# Patient Record
Sex: Female | Born: 1970 | Race: Black or African American | Hispanic: No | Marital: Married | Smoking: Never smoker
Health system: Southern US, Community
[De-identification: ages and names within clinical notes are randomized; demographics above are authoritative.]

## PROBLEM LIST (undated history)

## (undated) DIAGNOSIS — I10 Essential (primary) hypertension: Secondary | ICD-10-CM

## (undated) HISTORY — DX: Essential (primary) hypertension: I10

## (undated) HISTORY — PX: APPENDECTOMY: SHX54

---

## 2009-12-27 ENCOUNTER — Emergency Department (HOSPITAL_COMMUNITY): Admission: EM | Admit: 2009-12-27 | Discharge: 2009-12-27 | Payer: Self-pay | Admitting: Family Medicine

## 2009-12-27 ENCOUNTER — Inpatient Hospital Stay (HOSPITAL_COMMUNITY): Admission: EM | Admit: 2009-12-27 | Discharge: 2009-12-30 | Payer: Self-pay | Admitting: Emergency Medicine

## 2010-01-05 ENCOUNTER — Encounter: Admission: RE | Admit: 2010-01-05 | Discharge: 2010-01-05 | Payer: Self-pay | Admitting: General Surgery

## 2010-01-09 ENCOUNTER — Ambulatory Visit (HOSPITAL_COMMUNITY): Admission: RE | Admit: 2010-01-09 | Discharge: 2010-01-09 | Payer: Self-pay | Admitting: General Surgery

## 2010-12-01 LAB — CBC
HCT: 28.6 % — ABNORMAL LOW (ref 36.0–46.0)
Hemoglobin: 13.3 g/dL (ref 12.0–15.0)
Hemoglobin: 9.7 g/dL — ABNORMAL LOW (ref 12.0–15.0)
Hemoglobin: 9.9 g/dL — ABNORMAL LOW (ref 12.0–15.0)
MCHC: 34.5 g/dL (ref 30.0–36.0)
MCV: 88.8 fL (ref 78.0–100.0)
Platelets: 247 10*3/uL (ref 150–400)
Platelets: 256 10*3/uL (ref 150–400)
Platelets: 309 10*3/uL (ref 150–400)
RBC: 3.22 MIL/uL — ABNORMAL LOW (ref 3.87–5.11)
RBC: 3.35 MIL/uL — ABNORMAL LOW (ref 3.87–5.11)
RBC: 4.37 MIL/uL (ref 3.87–5.11)
RDW: 12.3 % (ref 11.5–15.5)
RDW: 12.3 % (ref 11.5–15.5)
WBC: 15.9 10*3/uL — ABNORMAL HIGH (ref 4.0–10.5)
WBC: 20.4 10*3/uL — ABNORMAL HIGH (ref 4.0–10.5)
WBC: 5.9 10*3/uL (ref 4.0–10.5)
WBC: 9.8 10*3/uL (ref 4.0–10.5)

## 2010-12-01 LAB — DIFFERENTIAL
Basophils Absolute: 0 10*3/uL (ref 0.0–0.1)
Basophils Relative: 0 % (ref 0–1)
Eosinophils Absolute: 0 10*3/uL (ref 0.0–0.7)
Eosinophils Relative: 0 % (ref 0–5)
Lymphocytes Relative: 9 % — ABNORMAL LOW (ref 12–46)
Monocytes Absolute: 0.9 10*3/uL (ref 0.1–1.0)
Monocytes Relative: 4 % (ref 3–12)
Neutrophils Relative %: 87 % — ABNORMAL HIGH (ref 43–77)

## 2010-12-01 LAB — URINALYSIS, ROUTINE W REFLEX MICROSCOPIC
Hgb urine dipstick: NEGATIVE
Ketones, ur: >80 mg/dL — AB
Nitrite: NEGATIVE
Specific Gravity, Urine: 1.015 (ref 1.005–1.030)
Urobilinogen, UA: 1 mg/dL (ref 0.0–1.0)

## 2010-12-01 LAB — POCT URINALYSIS DIP (DEVICE)
Glucose, UA: 100 mg/dL — AB
Protein, ur: 100 mg/dL — AB
Urobilinogen, UA: 2 mg/dL — ABNORMAL HIGH (ref 0.0–1.0)

## 2010-12-01 LAB — POCT I-STAT, CHEM 8
BUN: 5 mg/dL — ABNORMAL LOW (ref 6–23)
Chloride: 102 mEq/L (ref 96–112)
HCT: 40 % (ref 36.0–46.0)
Sodium: 135 mEq/L (ref 135–145)

## 2010-12-01 LAB — CULTURE, ROUTINE-ABSCESS

## 2010-12-01 LAB — PROTIME-INR: Prothrombin Time: 14 seconds (ref 11.6–15.2)

## 2010-12-01 LAB — POCT PREGNANCY, URINE: Preg Test, Ur: NEGATIVE

## 2011-01-15 IMAGING — CT CT ABD-PELV W/ CM
2 of 4 series · 10 of 36 positions shown, 17 images · IV contrast (READICAT/WATER & [ID] OMNI [id number])
Comparison: CT abdomen pelvis of 12/27/2009

CLINICAL DATA: Follow up of pelvic abscess after ruptured appendix,
drainage catheter in place

CT ABDOMEN AND PELVIS WITH CONTRAST
TECHNIQUE: Multidetector CT imaging of the abdomen and pelvis was
performed following the standard protocol during bolus
administration of intravenous contrast.
Contrast: 100 ml Zmnipaque-D00

[Series 3: routine abdomen · axial · 0.62mm/px · z∈[-396,-116]mm · 9 of 70 slices shown, 15 images]
[im 7/70  soft-tissue]
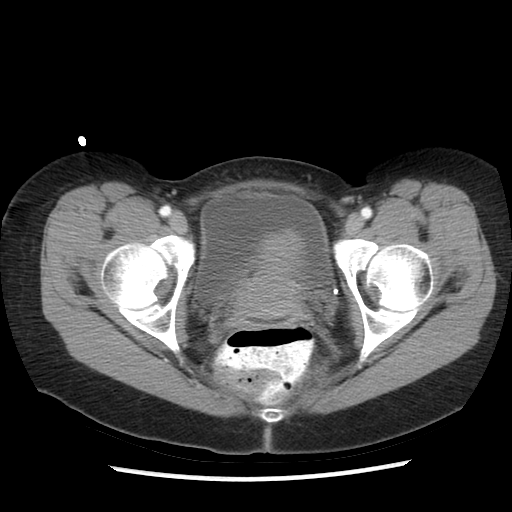
[im 7/70  bone]
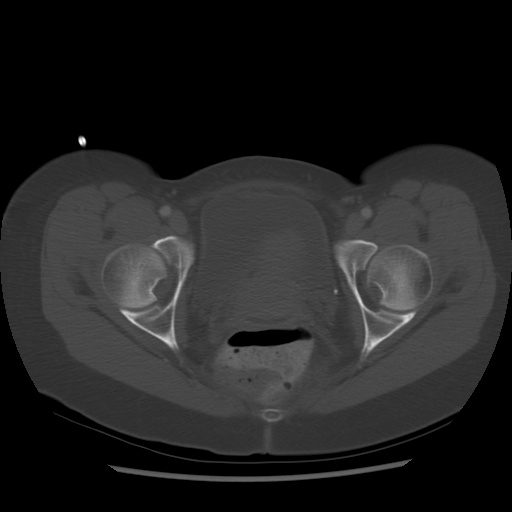
[im 14/70  soft-tissue]
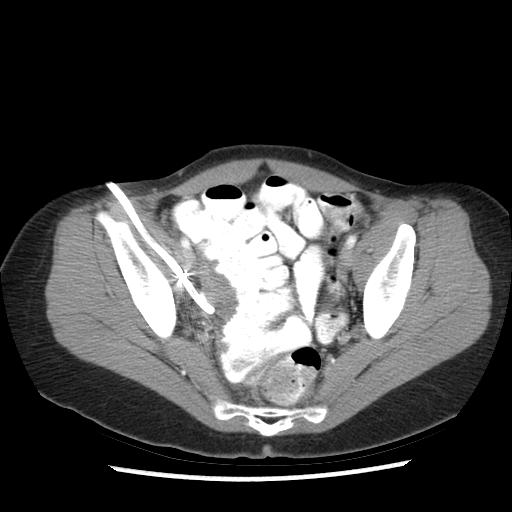
[im 21/70  soft-tissue]
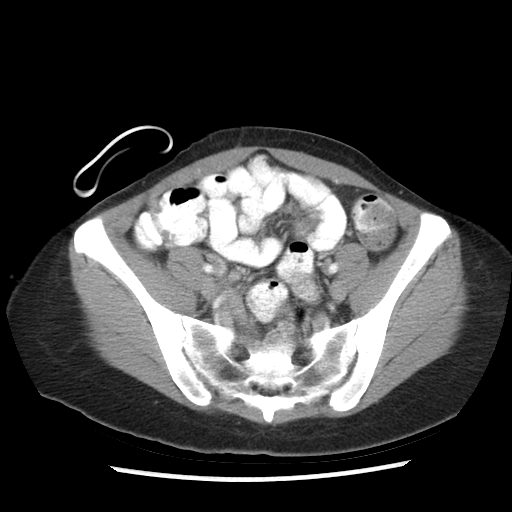
[im 28/70  soft-tissue]
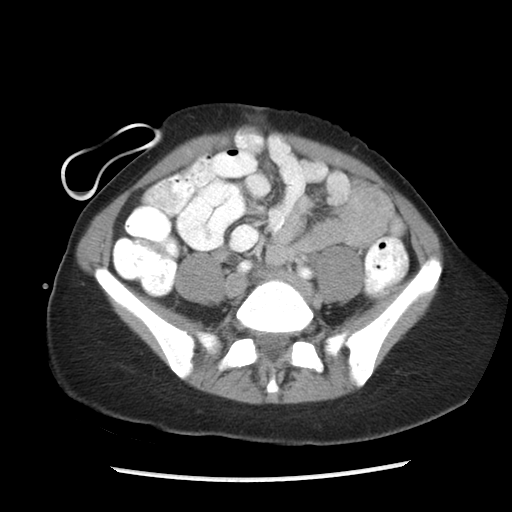
[im 35/70  soft-tissue]
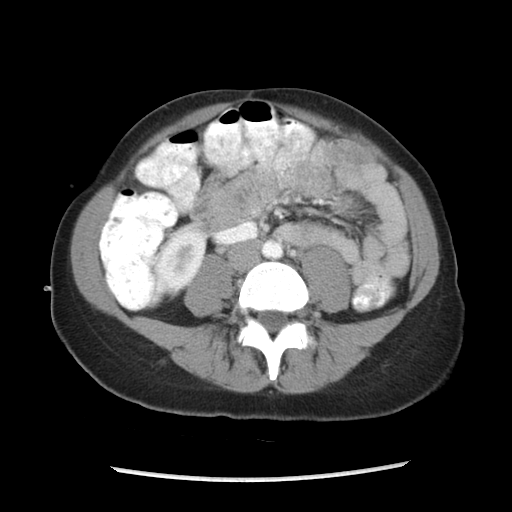
[im 42/70  soft-tissue]
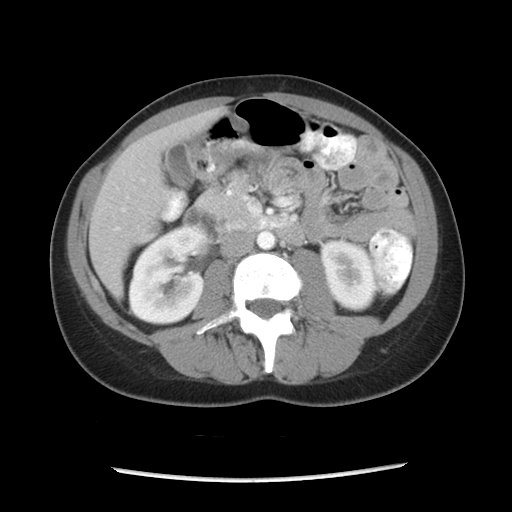
[im 42/70  lung]
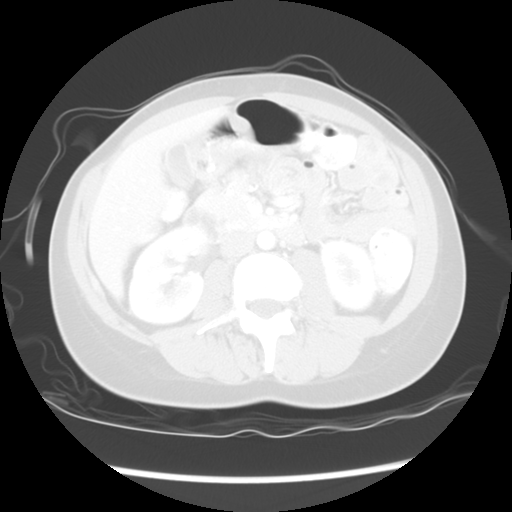
[im 49/70  soft-tissue]
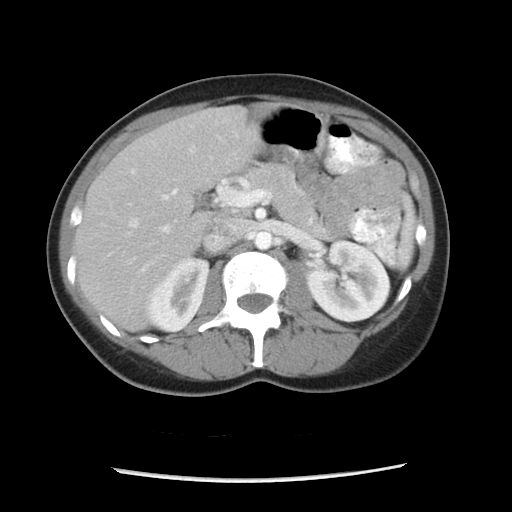
[im 49/70  lung]
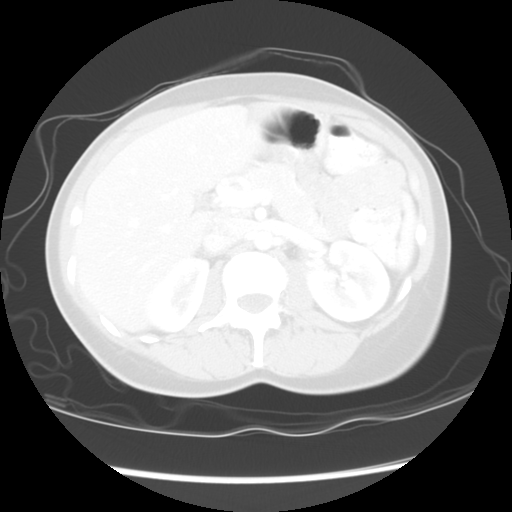
[im 56/70  soft-tissue]
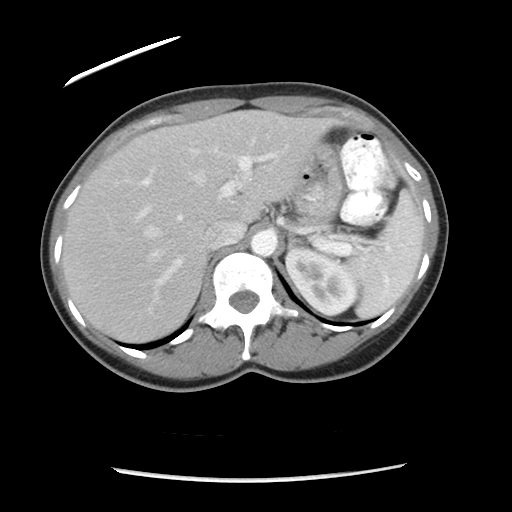
[im 56/70  lung]
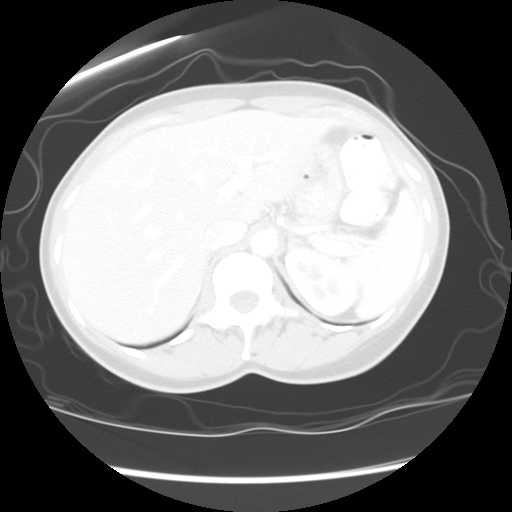
[im 63/70  soft-tissue]
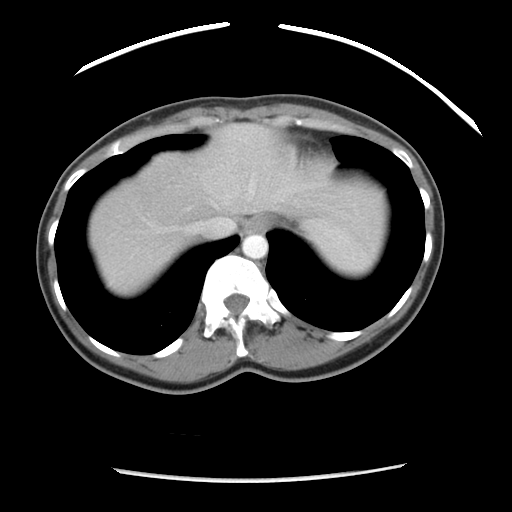
[im 63/70  lung]
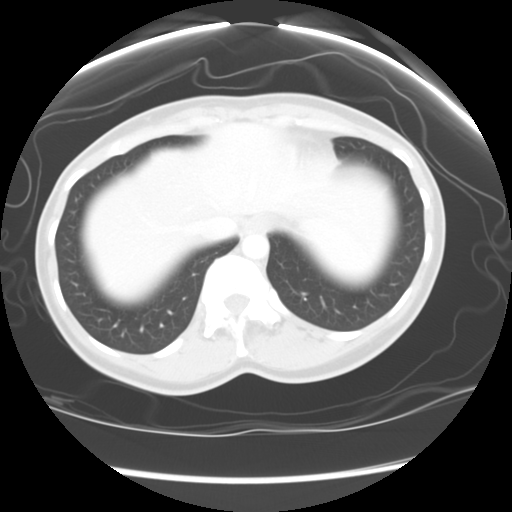
[im 63/70  bone]
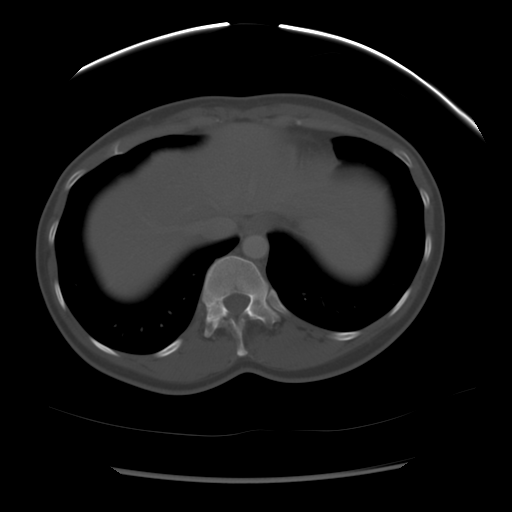

[Series 601: coronal body · coronal · 0.77mm/px · 1 of 98 slices shown, 2 images]
[im 33/98  soft-tissue]
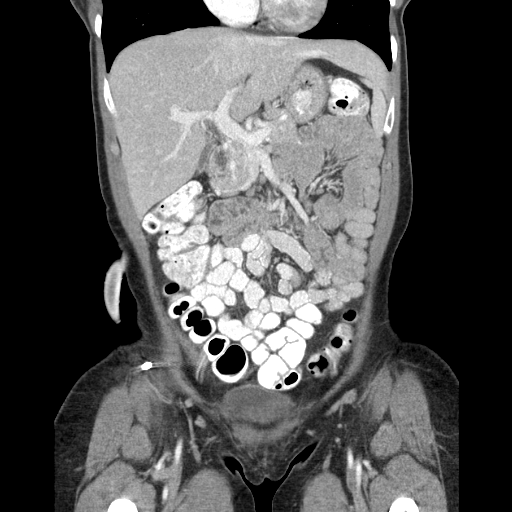
[im 33/98  bone]
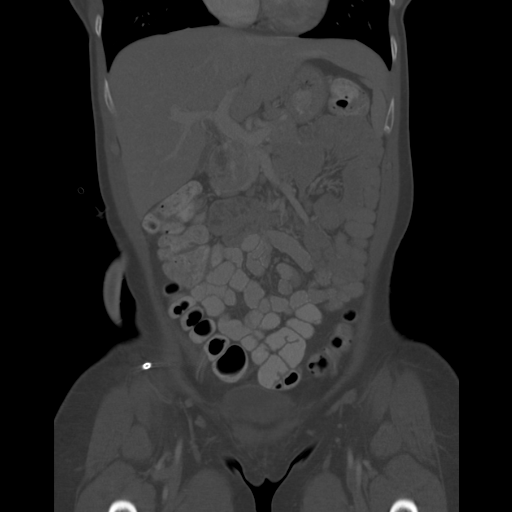

[10 of 36 positions shown; findings below may reference images not displayed]

FINDINGS: The lung bases are clear.  The liver enhances with no
focal abnormality and no ductal dilatation is seen.  Some fatty
infiltration near the false formal ligament is stable.  No
calcified gallstones are seen.  The pancreas is normal in size and
the pancreatic duct is not dilated.  The adrenal glands and spleen
are unremarkable.  The kidneys enhance with no calculus or mass and
no hydronephrosis is seen.  The abdominal aorta is normal in
caliber.

The percutaneous abscess drainage catheter remains in the right
pelvic sidewall and the abscess cavity is decompressed.  No
residual fluid collection is seen.  The uterus is normal in size.
The urinary bladder is unremarkable.  The cecum is very low lying
within the posterior pelvis.  The terminal ileum is unremarkable.
IMPRESSION: Percutaneous abscess drainage catheter remains in place in the
right pelvic sidewall with decompression of the pelvic abscess.  No
residual abscess cavity is seen.  No free fluid is noted.

## 2012-01-17 ENCOUNTER — Telehealth: Payer: Self-pay

## 2012-01-17 NOTE — Telephone Encounter (Signed)
Rf's called for Nuvaring. 1 PV q 3 weeks w/ RF's to get her through til AEX 04/14/2012. Melody Comas A

## 2012-02-10 ENCOUNTER — Telehealth: Payer: Self-pay

## 2012-02-10 NOTE — Telephone Encounter (Signed)
LM for pt to cb and r/s AEX booked for 04/14/2012. Dr. Su Hilt is going to be on call that day. Melody Comas A

## 2012-02-16 NOTE — Telephone Encounter (Signed)
Chart note for encounter closure only. JO CMA

## 2012-02-25 ENCOUNTER — Other Ambulatory Visit: Payer: Self-pay | Admitting: Obstetrics and Gynecology

## 2012-02-25 ENCOUNTER — Ambulatory Visit (HOSPITAL_BASED_OUTPATIENT_CLINIC_OR_DEPARTMENT_OTHER)
Admission: RE | Admit: 2012-02-25 | Discharge: 2012-02-25 | Disposition: A | Discharge status: Home / Self Care | Payer: BC Managed Care – PPO | Source: Ambulatory Visit | Attending: Obstetrics and Gynecology | Admitting: Obstetrics and Gynecology

## 2012-02-25 ENCOUNTER — Other Ambulatory Visit (HOSPITAL_COMMUNITY)
Admission: RE | Admit: 2012-02-25 | Discharge: 2012-02-25 | Disposition: A | Discharge status: Home / Self Care | Payer: BC Managed Care – PPO | Source: Ambulatory Visit | Attending: Obstetrics and Gynecology | Admitting: Obstetrics and Gynecology

## 2012-02-25 DIAGNOSIS — Z1231 Encounter for screening mammogram for malignant neoplasm of breast: Secondary | ICD-10-CM

## 2012-02-25 DIAGNOSIS — Z124 Encounter for screening for malignant neoplasm of cervix: Secondary | ICD-10-CM | POA: Insufficient documentation

## 2012-03-31 ENCOUNTER — Ambulatory Visit: Payer: Self-pay | Admitting: Obstetrics and Gynecology

## 2012-04-14 ENCOUNTER — Ambulatory Visit: Payer: Self-pay | Admitting: Obstetrics and Gynecology

## 2012-05-03 ENCOUNTER — Telehealth: Payer: Self-pay

## 2012-05-03 NOTE — Telephone Encounter (Signed)
Denied RF for Nuvaring. Pt is well overdue for AEX and has cancelled her last appts x 2. NO RF until pt makes and keeps appt. Sophia Mason A

## 2014-10-10 ENCOUNTER — Other Ambulatory Visit (HOSPITAL_BASED_OUTPATIENT_CLINIC_OR_DEPARTMENT_OTHER): Payer: Self-pay | Admitting: Obstetrics and Gynecology

## 2014-10-10 DIAGNOSIS — Z1231 Encounter for screening mammogram for malignant neoplasm of breast: Secondary | ICD-10-CM

## 2014-10-14 ENCOUNTER — Ambulatory Visit (HOSPITAL_BASED_OUTPATIENT_CLINIC_OR_DEPARTMENT_OTHER)
Admission: RE | Admit: 2014-10-14 | Discharge: 2014-10-14 | Disposition: A | Discharge status: Home / Self Care | Payer: BC Managed Care – PPO | Source: Ambulatory Visit | Attending: Obstetrics and Gynecology | Admitting: Obstetrics and Gynecology

## 2014-10-14 DIAGNOSIS — Z1231 Encounter for screening mammogram for malignant neoplasm of breast: Secondary | ICD-10-CM | POA: Insufficient documentation

## 2015-07-21 ENCOUNTER — Other Ambulatory Visit (HOSPITAL_BASED_OUTPATIENT_CLINIC_OR_DEPARTMENT_OTHER): Payer: Self-pay | Admitting: Obstetrics and Gynecology

## 2015-07-21 DIAGNOSIS — Z1231 Encounter for screening mammogram for malignant neoplasm of breast: Secondary | ICD-10-CM

## 2015-10-20 ENCOUNTER — Ambulatory Visit (HOSPITAL_BASED_OUTPATIENT_CLINIC_OR_DEPARTMENT_OTHER)
Admission: RE | Admit: 2015-10-20 | Discharge: 2015-10-20 | Disposition: A | Discharge status: Home / Self Care | Payer: BC Managed Care – PPO | Source: Ambulatory Visit | Attending: Obstetrics and Gynecology | Admitting: Obstetrics and Gynecology

## 2015-10-20 ENCOUNTER — Ambulatory Visit (HOSPITAL_BASED_OUTPATIENT_CLINIC_OR_DEPARTMENT_OTHER): Payer: BC Managed Care – PPO

## 2015-10-20 DIAGNOSIS — Z1231 Encounter for screening mammogram for malignant neoplasm of breast: Secondary | ICD-10-CM | POA: Diagnosis not present

## 2017-02-22 ENCOUNTER — Other Ambulatory Visit (HOSPITAL_BASED_OUTPATIENT_CLINIC_OR_DEPARTMENT_OTHER): Payer: Self-pay | Admitting: Obstetrics and Gynecology

## 2017-02-22 DIAGNOSIS — Z1231 Encounter for screening mammogram for malignant neoplasm of breast: Secondary | ICD-10-CM

## 2017-03-04 ENCOUNTER — Ambulatory Visit (HOSPITAL_BASED_OUTPATIENT_CLINIC_OR_DEPARTMENT_OTHER)
Admission: RE | Admit: 2017-03-04 | Discharge: 2017-03-04 | Disposition: A | Discharge status: Home / Self Care | Payer: BC Managed Care – PPO | Source: Ambulatory Visit | Attending: Obstetrics and Gynecology | Admitting: Obstetrics and Gynecology

## 2017-03-04 DIAGNOSIS — Z1231 Encounter for screening mammogram for malignant neoplasm of breast: Secondary | ICD-10-CM | POA: Diagnosis not present

## 2018-05-16 ENCOUNTER — Encounter: Payer: Self-pay | Admitting: Cardiovascular Disease

## 2018-05-16 ENCOUNTER — Ambulatory Visit: Payer: BC Managed Care – PPO | Admitting: Cardiovascular Disease

## 2018-05-16 VITALS — BP 120/83 | HR 83 | Ht 62.0 in | Wt 120.4 lb

## 2018-05-16 DIAGNOSIS — Z5181 Encounter for therapeutic drug level monitoring: Secondary | ICD-10-CM | POA: Diagnosis not present

## 2018-05-16 DIAGNOSIS — I10 Essential (primary) hypertension: Secondary | ICD-10-CM | POA: Diagnosis not present

## 2018-05-16 HISTORY — DX: Essential (primary) hypertension: I10

## 2018-05-16 MED ORDER — HYDROCHLOROTHIAZIDE 12.5 MG PO TABS: 12.5000 mg | ORAL_TABLET | Freq: Every day | ORAL | 1 refills | Status: DC

## 2018-05-16 MED ORDER — AMLODIPINE BESYLATE 5 MG PO TABS: 5.0000 mg | ORAL_TABLET | Freq: Every day | ORAL | 1 refills | Status: DC

## 2018-05-16 NOTE — Progress Notes (Signed)
Cardiology Office Note   Date:  05/16/2018   ID:  Sophia Mason, DOB 1971/09/03, MRN [id number]  PCP:  Sophia Better, MD  Cardiologist:   Sophia Si, MD   No chief complaint on file.     History of Present Illness: Sophia Mason is a 47 y.o. female who is being seen today for the evaluation of hypertension at the request of Sophia Lindau, NP.  Sophia Mason was first diagnosed with hypertension in approximately 2016.  She saw her OB/GYN and her blood pressure was noted to be elevated.  She changed her birth control method and started on the DASH diet with some improvement in her blood pressure.  However when she followed up 10/2017 her blood pressure remained elevated.  She was referred to Dr. Knox Mason 11/2017 and started on amlodipine 5 mg daily.  The dose was increased to 10 mg and her blood pressure has been mostly in the 120s over 80s since that time.  Her only complaint is that she has some ankle swelling with this medication.  She denies orthopnea or PND.  In general she is very active she tries to exercise at least 2 days/week.  She has no chest pain or shortness of breath with this activity.  She rarely has palpitations.  She has no lightheadedness or dizziness.  She continues to follow a low-sodium diet.  She is unsure why she has hypertension, is no one else in her family developed hypertension, especially at such young age.  She does report a history of preeclampsia.   Past Medical History:  Diagnosis Date  . Essential hypertension 05/16/2018  . Hypertension     History reviewed. No pertinent surgical history.   Current Outpatient Medications  Medication Sig Dispense Refill  . amLODipine (NORVASC) 5 MG tablet Take 1 tablet (5 mg total) by mouth daily. 90 tablet 1  . PARAGARD INTRAUTERINE COPPER IU by Intrauterine route.    . hydrochlorothiazide (HYDRODIURIL) 12.5 MG tablet Take 1 tablet (12.5 mg total) by mouth daily. 90 tablet 1   No current facility-administered  medications for this visit.     Allergies:   Patient has no known allergies.    Social History:  The patient  reports that she has never smoked. She has never used smokeless tobacco. She reports that she drinks alcohol.   Family History:  The patient's family history includes Breast cancer in her maternal aunt; Diabetes in her maternal aunt; Glaucoma in her maternal grandfather and mother; Hyperlipidemia in her father; Hypertension in her maternal grandfather.    ROS:  Please see the history of present illness.   Otherwise, review of systems are positive for none.   All other systems are reviewed and negative.    PHYSICAL EXAM: VS:  BP 120/83 (BP Location: Left Arm)   Pulse 83   Ht 5\' 2"  (1.575 m)   Wt 120 lb 6.4 oz (54.6 kg)   BMI 22.02 kg/m  , BMI Body mass index is 22.02 kg/m. GENERAL:  Well appearing HEENT:  Pupils equal round and reactive, fundi not visualized, oral mucosa unremarkable NECK:  No jugular venous distention, waveform within normal limits, carotid upstroke brisk and symmetric, no bruits LUNGS:  Clear to auscultation bilaterally HEART:  RRR.  PMI not displaced or sustained,S1 and S2 within normal limits, no S3, no S4, no clicks, no rubs, no murmurs ABD:  Flat, positive bowel sounds normal in frequency in pitch, no bruits, no rebound, no guarding, no midline pulsatile mass,  no hepatomegaly, no splenomegaly EXT:  2 plus pulses throughout, no edema, no cyanosis no clubbing SKIN:  No rashes no nodules NEURO:  Cranial nerves II through XII grossly intact, motor grossly intact throughout PSYCH:  Cognitively intact, oriented to person place and time   EKG:  EKG is ordered today. The ekg ordered today demonstrates sinus rhythm rate 83 bpm.   Recent Labs: No results found for requested labs within last 8760 hours.    Lipid Panel No results found for: CHOL, TRIG, HDL, CHOLHDL, VLDL, LDLCALC, LDLDIRECT    Wt Readings from Last 3 Encounters:  05/16/18 120 lb 6.4 oz  (54.6 kg)      ASSESSMENT AND PLAN:  # Essential hypertension:  BP is controlled but she has LE edema on amlodipine.  We will reduce the dose to 5 mg and add HCTZ 12.5 mg.  She will have a basic metabolic panel checked in 1 week.  Given that her blood pressure is so easily controlled with one agent it is unlikely that she has renal artery stenosis.  We will get a copy of her lipids from her PCP.   Current medicines are reviewed at length with the patient today.  The patient does not have concerns regarding medicines.  The following changes have been made:  Reduce amlodipine.  Start HCTZ.  Labs/ tests ordered today include:   Orders Placed This Encounter  Procedures  . Basic metabolic panel     Disposition:   FU with Sophia Sun C. Duke Salvia, MD, [facility] in 2 months     Signed, Sophia Aker C. Duke Salvia, MD, [facility]  05/16/2018 1:11 PM     [facility]

## 2018-05-16 NOTE — Patient Instructions (Addendum)
Medication Instructions:  DECREASE YOUR AMLODIPINE TO 5 MG DAILY  START HYDROCHLOROTHIAZIDE 12.5 MG DAILY   Labwork: BMET IN 1 WEEK   Testing/Procedures: NONE  Follow-Up: Your physician recommends that you schedule a follow-up appointment in: 2 MONTHS   Any Other Special Instructions Will Be Listed Below (If Applicable).  DR Dorothyann Peng [address] [phone]  If you need a refill on your cardiac medications before your next appointment, please call your pharmacy.

## 2018-05-26 LAB — BASIC METABOLIC PANEL
BUN / CREAT RATIO: 22 (ref 9–23)
BUN: 15 mg/dL (ref 6–24)
CO2: 25 mmol/L (ref 20–29)
Calcium: 10 mg/dL (ref 8.7–10.2)
Chloride: 96 mmol/L (ref 96–106)
Creatinine, Ser: 0.67 mg/dL (ref 0.57–1.00)
GFR calc non Af Amer: 106 mL/min/{1.73_m2} (ref >59)
GFR, EST AFRICAN AMERICAN: 122 mL/min/{1.73_m2} (ref >59)
Glucose: 91 mg/dL (ref 65–99)
POTASSIUM: 3.7 mmol/L (ref 3.5–5.2)
Sodium: 139 mmol/L (ref 134–144)

## 2018-07-06 ENCOUNTER — Other Ambulatory Visit (HOSPITAL_BASED_OUTPATIENT_CLINIC_OR_DEPARTMENT_OTHER): Payer: Self-pay | Admitting: Obstetrics and Gynecology

## 2018-07-06 DIAGNOSIS — Z1231 Encounter for screening mammogram for malignant neoplasm of breast: Secondary | ICD-10-CM

## 2018-07-10 ENCOUNTER — Ambulatory Visit (HOSPITAL_BASED_OUTPATIENT_CLINIC_OR_DEPARTMENT_OTHER)
Admission: RE | Admit: 2018-07-10 | Discharge: 2018-07-10 | Disposition: A | Discharge status: Home / Self Care | Payer: BC Managed Care – PPO | Source: Ambulatory Visit | Attending: Obstetrics and Gynecology | Admitting: Obstetrics and Gynecology

## 2018-07-10 DIAGNOSIS — Z1231 Encounter for screening mammogram for malignant neoplasm of breast: Secondary | ICD-10-CM | POA: Diagnosis not present

## 2018-07-17 ENCOUNTER — Encounter: Payer: Self-pay | Admitting: Cardiovascular Disease

## 2018-07-17 ENCOUNTER — Ambulatory Visit: Payer: BC Managed Care – PPO | Admitting: Cardiovascular Disease

## 2018-07-17 VITALS — BP 112/66 | HR 94 | Ht 62.0 in | Wt 121.6 lb

## 2018-07-17 DIAGNOSIS — I10 Essential (primary) hypertension: Secondary | ICD-10-CM

## 2018-07-17 MED ORDER — AMLODIPINE BESYLATE 5 MG PO TABS: 5.0000 mg | ORAL_TABLET | Freq: Every day | ORAL | 3 refills | Status: DC

## 2018-07-17 MED ORDER — HYDROCHLOROTHIAZIDE 12.5 MG PO TABS: 12.5000 mg | ORAL_TABLET | Freq: Every day | ORAL | 3 refills | Status: DC

## 2018-07-17 NOTE — Patient Instructions (Signed)
Medication Instructions:  ?Your physician recommends that you continue on your current medications as directed. Please refer to the Current Medication list given to you today.  ? ?Labwork: ?NONE ? ?Testing/Procedures: ?NONE ? ?Follow-Up: ?AS NEEDED  ? ?  ?

## 2018-07-17 NOTE — Progress Notes (Signed)
Cardiology Office Note   Date:  07/17/2018   ID:  Sophia Mason, DOB 1971/05/26, MRN [id number]  PCP:  Maxie Better, MD  Cardiologist:   Chilton Si, MD   No chief complaint on file.     History of Present Illness: Sophia Mason is a 47 y.o. female here for follow up.  Sophia Mason was initially seen 05/2018 for hypertension management.  Sophia Mason was first diagnosed with hypertension in approximately 2016.  Sophia Mason saw her OB/GYN and her blood pressure was noted to be elevated.  Sophia Mason changed her birth control method and started on Sophia DASH diet with some improvement in her blood pressure.  However when Sophia Mason followed up 10/2017 her blood pressure remained elevated.  Sophia Mason was referred to Dr. Knox Royalty 11/2017 and started on amlodipine 5 mg daily.  Sophia dose was increased to 10 mg and her blood pressure is well have been controlled but Sophia Mason developed lower extremity edema.  At her last appointment distance Sophia Mason was started on HCTZ.  Since then her blood pressure has been very well-controlled.  Sophia Mason works out regularly and notices that for Sophia first month Sophia Mason had some mild lightheadedness with positional changes during exercise.  This is subsequently subsided.  Sophia Mason has not experienced any lower extremity edema, orthopnea, PND, chest pain, or shortness of breath.  Overall Sophia Mason has been feeling well and has no complaints.    Past Medical History:  Diagnosis Date  . Essential hypertension 05/16/2018  . Hypertension     History reviewed. No pertinent surgical history.   Current Outpatient Medications  Medication Sig Dispense Refill  . amLODipine (NORVASC) 5 MG tablet Take 1 tablet (5 mg total) by mouth daily. 90 tablet 3  . hydrochlorothiazide (HYDRODIURIL) 12.5 MG tablet Take 1 tablet (12.5 mg total) by mouth daily. 90 tablet 3  . PARAGARD INTRAUTERINE COPPER IU by Intrauterine route.     No current facility-administered medications for this visit.     Allergies:   Sophia Mason has no known allergies.     Social History:  Sophia Mason  reports that Sophia Mason has never smoked. Sophia Mason has never used smokeless tobacco. Sophia Mason reports that Sophia Mason drinks alcohol.   Family History:  Sophia Mason's family history includes Breast cancer in her maternal aunt; Diabetes in her maternal aunt; Glaucoma in her maternal grandfather and mother; Hyperlipidemia in her father; Hypertension in her maternal grandfather.    ROS:  Please see Sophia history of present illness.   Otherwise, review of systems are positive for none.   All other systems are reviewed and negative.    PHYSICAL EXAM: VS:  BP 112/66   Pulse 94   Ht 5\' 2"  (1.575 m)   Wt 121 lb 9.6 oz (55.2 kg)   BMI 22.24 kg/m  , BMI Body mass index is 22.24 kg/m. GENERAL:  Well appearing HEENT: Pupils equal round and reactive, fundi not visualized, oral mucosa unremarkable NECK:  No jugular venous distention, waveform within normal limits, carotid upstroke brisk and symmetric, no bruits LUNGS:  Clear to auscultation bilaterally HEART:  RRR.  PMI not displaced or sustained,S1 and S2 within normal limits, no S3, no S4, no clicks, no rubs, no murmurs ABD:  Flat, positive bowel sounds normal in frequency in pitch, no bruits, no rebound, no guarding, no midline pulsatile mass, no hepatomegaly, no splenomegaly EXT:  2 plus pulses throughout, no edema, no cyanosis no clubbing SKIN:  No rashes no nodules NEURO:  Cranial nerves II through XII grossly  intact, motor grossly intact throughout PSYCH:  Cognitively intact, oriented to person place and time   EKG:  EKG is not ordered today. Sophia ekg ordered 05/16/2018 demonstrates sinus rhythm rate 83 bpm.   Recent Labs: 05/25/2018: BUN 15; Creatinine, Ser 0.67; Potassium 3.7; Sodium 139    Lipid Panel No results found for: CHOL, TRIG, HDL, CHOLHDL, VLDL, LDLCALC, LDLDIRECT    Wt Readings from Last 3 Encounters:  07/17/18 121 lb 9.6 oz (55.2 kg)  05/16/18 120 lb 6.4 oz (54.6 kg)      ASSESSMENT AND PLAN:  # Essential  hypertension:  BP is very well-controlled.  Sophia Mason no longer has lower extremity edema since starting HCTZ.  Continue amlodipine and HCTZ.  Electrolytes and renal function were stable on repeat.   Current medicines are reviewed at length with Sophia Mason today.  Sophia Mason does not have concerns regarding medicines.  Sophia following changes have been made: None  Labs/ tests ordered today include:   No orders of Sophia defined types were placed in this encounter.    Disposition:   FU with Velda Wendt C. Duke Salvia, MD, [facility] as needed.   Signed, Roper Tolson C. Duke Salvia, MD, [facility]  07/17/2018 1:14 PM    [facility]

## 2019-07-11 ENCOUNTER — Other Ambulatory Visit (HOSPITAL_BASED_OUTPATIENT_CLINIC_OR_DEPARTMENT_OTHER): Payer: Self-pay | Admitting: Obstetrics and Gynecology

## 2019-07-11 DIAGNOSIS — Z1231 Encounter for screening mammogram for malignant neoplasm of breast: Secondary | ICD-10-CM

## 2019-07-20 ENCOUNTER — Other Ambulatory Visit: Payer: Self-pay | Admitting: Cardiovascular Disease

## 2019-07-20 ENCOUNTER — Other Ambulatory Visit: Payer: Self-pay

## 2019-07-20 ENCOUNTER — Ambulatory Visit (HOSPITAL_BASED_OUTPATIENT_CLINIC_OR_DEPARTMENT_OTHER)
Admission: RE | Admit: 2019-07-20 | Discharge: 2019-07-20 | Disposition: A | Discharge status: Home / Self Care | Payer: BC Managed Care – PPO | Source: Ambulatory Visit | Attending: Obstetrics and Gynecology | Admitting: Obstetrics and Gynecology

## 2019-07-20 DIAGNOSIS — Z1231 Encounter for screening mammogram for malignant neoplasm of breast: Secondary | ICD-10-CM | POA: Diagnosis present

## 2019-07-20 NOTE — Telephone Encounter (Signed)
Rx request sent to pharmacy.  

## 2020-09-15 ENCOUNTER — Other Ambulatory Visit: Payer: Self-pay | Admitting: Cardiovascular Disease

## 2020-09-16 ENCOUNTER — Telehealth: Payer: Self-pay | Admitting: Cardiovascular Disease

## 2020-09-16 NOTE — Telephone Encounter (Signed)
°*  STAT* If patient is at the pharmacy, call can be transferred to refill team.   1. Which medications need to be refilled? (please list name of each medication and dose if known)  hydrochlorothiazide (HYDRODIURIL) 12.5 MG tablet amLODipine (NORVASC) 5 MG tablet  2. Which pharmacy/location (including street and city if local pharmacy) is medication to be sent to? [id number] - [place], [place] - [address]  3. Do they need a 30 day or 90 day supply? 90 with refills  Patient is scheduled to see Edd Fabian, NP 01.31.22 at 3:45 pm

## 2020-09-17 NOTE — Telephone Encounter (Signed)
Pt c/o medication issue:  1. Name of Medication: hydrochlorothiazide (HYDRODIURIL) 12.5 MG tablet amLODipine (NORVASC) 5 MG tablet  2. How are you currently taking this medication (dosage and times per day)? As prescribed  3. Are you having a reaction (difficulty breathing--STAT)? No   4. What is your medication issue? Patient states she has 2 hydrochlorothiazide tablet and no more amlodipine remaining. She would like to know if she can take a hydrochlorothiazide tablet in place of the amlodipine since she is completely out. Please advise.

## 2020-09-19 ENCOUNTER — Other Ambulatory Visit: Payer: Self-pay | Admitting: Cardiology

## 2020-09-19 ENCOUNTER — Telehealth: Payer: Self-pay | Admitting: Cardiology

## 2020-09-19 MED ORDER — AMLODIPINE BESYLATE 5 MG PO TABS: 5.0000 mg | ORAL_TABLET | Freq: Every day | ORAL | 0 refills | Status: DC

## 2020-09-19 MED ORDER — HYDROCHLOROTHIAZIDE 12.5 MG PO TABS: ORAL_TABLET | ORAL | 0 refills | Status: DC

## 2020-09-19 NOTE — Telephone Encounter (Signed)
Patient called in for refill request. Sent to requested pharmacy

## 2020-09-30 ENCOUNTER — Other Ambulatory Visit (HOSPITAL_BASED_OUTPATIENT_CLINIC_OR_DEPARTMENT_OTHER): Payer: Self-pay | Admitting: Obstetrics and Gynecology

## 2020-09-30 DIAGNOSIS — Z1231 Encounter for screening mammogram for malignant neoplasm of breast: Secondary | ICD-10-CM

## 2020-10-06 NOTE — Progress Notes (Signed)
Cardiology Clinic Note   Patient Name: Sophia Mason Date of Encounter: 10/13/2020  Primary Care Provider:  Maxie Better, MD Primary Cardiologist:  Chilton Si, MD  Patient Profile    Sophia Mason 50 year old female presents the clinic today for follow-up evaluation of her essential hypertension.  Past Medical History    Past Medical History:  Diagnosis Date  . Essential hypertension 05/16/2018  . Hypertension    History reviewed. No pertinent surgical history.  Allergies  No Known Allergies  History of Present Illness    Sophia Mason has a PMH of hypertension.  She was initially referred to Dr. Duke Salvia 9/19 for hypertension management.  She had been first diagnosed with high blood pressure in 2016.  She was seen by her OB/GYN who noted it was elevated.  At that time her birth control medication once changed and she was started on the DASHdiet.  She did note some improvement with her blood pressure.  She followed up in February 2019 her blood pressure remained elevated.  At that time amlodipine was added to her medication regimen.  Her blood pressure remained poorly controlled and her amlodipine was increased to 10 mg.  She noticed better control however developed lower extremity edema.  Her amlodipine was reduced and she was started on HCTZ.  She was last seen by Dr. Duke Salvia on 07/17/2018.  During that time her blood pressure was well controlled.  She was working out regularly.  She reported that during her first month of working out she noted mild lightheadedness with postural changes during exercise.  It did however subside.  She did not experience further lower extremity swelling, orthopnea, PND, chest pain and also denied shortness of breath.  She presents the clinic today for follow-up evaluation and states she feels well.  She has been monitoring her blood pressure and reports that it has been well controlled.  She indicates that sometimes she will be in a rush to get out of  the house and forget to take her blood pressure medication.  She asked when/if she can take her blood pressure medication later in the day when she forgets.  We reviewed taking amlodipine later in the day and holding off on HCTZ due to increased urination before bed.  She continues to be physically active exercising 3-4 days/week doing activities such as strength training and cardiovascular exercise.  She prepares most of her meals at home and monitors her salt intake.  I will order a BMP, give her the salty 6 diet sheet, have her maintain her physical activity, and have her follow-up in 1 year.  Today she denies chest pain, shortness of breath, lower extremity edema, fatigue, palpitations, melena, hematuria, hemoptysis, diaphoresis, weakness, presyncope, syncope, orthopnea, and PND.   Home Medications    Prior to Admission medications   Medication Sig Start Date End Date Taking? Authorizing Provider  amLODipine (NORVASC) 5 MG tablet Take 1 tablet (5 mg total) by mouth daily. 09/19/20   Arty Baumgartner, NP  hydrochlorothiazide (HYDRODIURIL) 12.5 MG tablet TAKE 1 TABLET(12.5 MG) BY MOUTH DAILY 09/19/20   Arty Baumgartner, NP  [facility] INTRAUTERINE COPPER IU by Intrauterine route.    [provider]    Family History    Family History  Problem Relation Age of Onset  . Breast cancer Maternal Aunt   . Diabetes Maternal Aunt   . Hypertension Maternal Grandfather   . Glaucoma Maternal Grandfather   . Glaucoma Mother   . Hyperlipidemia Father  She indicated that the status of her mother is unknown. She indicated that the status of her father is unknown. She indicated that the status of her maternal grandfather is unknown. She indicated that her paternal grandmother is deceased. She indicated that her paternal grandfather is deceased. She indicated that the status of her maternal aunt is unknown.  Social History    Social History   Socioeconomic History  . Marital status: Married     Spouse name: Not on file  . Number of children: Not on file  . Years of education: Not on file  . Highest education level: Not on file  Occupational History  . Not on file  Tobacco Use  . Smoking status: Never Smoker  . Smokeless tobacco: Never Used  Substance and Sexual Activity  . Alcohol use: Yes    Comment: Occasional use   . Drug use: Not on file  . Sexual activity: Not on file  Other Topics Concern  . Not on file  Social History Narrative  . Not on file   Social Determinants of Health   Financial Resource Strain: Not on file  Food Insecurity: Not on file  Transportation Needs: Not on file  Physical Activity: Not on file  Stress: Not on file  Social Connections: Not on file  Intimate Partner Violence: Not on file     Review of Systems    General:  No chills, fever, night sweats or weight changes.  Cardiovascular:  No chest pain, dyspnea on exertion, edema, orthopnea, palpitations, paroxysmal nocturnal dyspnea. Dermatological: No rash, lesions/masses Respiratory: No cough, dyspnea Urologic: No hematuria, dysuria Abdominal:   No nausea, vomiting, diarrhea, bright red blood per rectum, melena, or hematemesis Neurologic:  No visual changes, wkns, changes in mental status. All other systems reviewed and are otherwise negative except as noted above.  Physical Exam    VS:  BP 112/72   Pulse 96   Ht 5\' 3"  (1.6 m)   Wt 124 lb 12.8 oz (56.6 kg)   SpO2 100%   BMI 22.11 kg/m  , BMI Body mass index is 22.11 kg/m. GEN: Well nourished, well developed, in no acute distress. HEENT: normal. Neck: Supple, no JVD, carotid bruits, or masses. Cardiac: RRR, no murmurs, rubs, or gallops. No clubbing, cyanosis, edema.  Radials/DP/PT 2+ and equal bilaterally.  Respiratory:  Respirations regular and unlabored, clear to auscultation bilaterally. GI: Soft, nontender, nondistended, BS + x 4. MS: no deformity or atrophy. Skin: warm and dry, no rash. Neuro:  Strength and sensation  are intact. Psych: Normal affect.  Accessory Clinical Findings    Recent Labs: No results found for requested labs within last 8760 hours.   Recent Lipid Panel No results found for: CHOL, TRIG, HDL, CHOLHDL, VLDL, LDLCALC, LDLDIRECT  ECG personally reviewed by me today-normal sinus rhythm 96 bpm no ST or T wave deviation- No acute changes  EKG 05/16/2018 Normal sinus rhythm 83 bpm  Assessment & Plan   1.  Essential hypertension-BP today 112/72.  Well-controlled at home. Continue amlodipine, hydrochlorothiazide Heart healthy low-sodium diet-salty 6 given Increase physical activity as tolerated Order BMP  Disposition: Follow-up with Dr. 03-29-1997 or me in 1 year.  Duke Salvia. Jazma Pickel NP-C    10/13/2020, 4:29 PM [facility] [address] Office [phone] Fax [phone]  Notice: This dictation was prepared with Dragon dictation along with smaller phrase technology. Any transcriptional errors that result from this process are unintentional and may not be corrected upon  review.  I spent 15 minutes examining this patient, reviewing medications, and using patient centered shared decision making involving her cardiac care.  Prior to her visit I spent greater than 20 minutes reviewing her past medical history,  medications, and prior cardiac tests.

## 2020-10-09 ENCOUNTER — Ambulatory Visit (HOSPITAL_BASED_OUTPATIENT_CLINIC_OR_DEPARTMENT_OTHER)
Admission: RE | Admit: 2020-10-09 | Discharge: 2020-10-09 | Disposition: A | Discharge status: Home / Self Care | Payer: BC Managed Care – PPO | Source: Ambulatory Visit | Attending: Obstetrics and Gynecology | Admitting: Obstetrics and Gynecology

## 2020-10-09 ENCOUNTER — Other Ambulatory Visit: Payer: Self-pay

## 2020-10-09 DIAGNOSIS — Z1231 Encounter for screening mammogram for malignant neoplasm of breast: Secondary | ICD-10-CM | POA: Diagnosis not present

## 2020-10-13 ENCOUNTER — Encounter: Payer: Self-pay | Admitting: General Practice

## 2020-10-13 ENCOUNTER — Ambulatory Visit: Payer: BC Managed Care – PPO | Admitting: General Practice

## 2020-10-13 ENCOUNTER — Other Ambulatory Visit: Payer: Self-pay

## 2020-10-13 VITALS — BP 112/72 | HR 96 | Ht 63.0 in | Wt 124.8 lb

## 2020-10-13 DIAGNOSIS — Z79899 Other long term (current) drug therapy: Secondary | ICD-10-CM

## 2020-10-13 DIAGNOSIS — I10 Essential (primary) hypertension: Secondary | ICD-10-CM | POA: Diagnosis not present

## 2020-10-13 NOTE — Patient Instructions (Signed)
Medication Instructions:  The current medical regimen is effective;  continue present plan and medications as directed. Please refer to the Current Medication list given to you today.  *If you need a refill on your cardiac medications before your next appointment, please call your pharmacy*  Lab Work:   Testing/Procedures:  BMET TODAY   NONE  Special Instructions PLEASE READ AND FOLLOW SALTY 6-ATTACHED-1,800mg  daily  PLEASE INCREASE PHYSICAL ACTIVITY AS TOLERATED  Follow-Up: Your next appointment:  12 month(s) In Person with You may see Chilton Si, MD or one of the following Advanced Practice Providers on your designated Care Team:  Edd Fabian, FNP  Corine Shelter, PA-C  Marjie Skiff, [place]  Please call our office 2 months in advance to schedule this appointment   At [facility], you and your health needs are our priority.  As part of our continuing mission to provide you with exceptional heart care, we have created designated Provider Care Teams.  These Care Teams include your primary Cardiologist (physician) and Advanced Practice Providers (APPs -  Physician Assistants and Nurse Practitioners) who all work together to provide you with the care you need, when you need it.            6 SALTY THINGS TO AVOID     1,800MG  DAILY

## 2020-10-14 ENCOUNTER — Other Ambulatory Visit: Payer: Self-pay

## 2020-10-14 DIAGNOSIS — I10 Essential (primary) hypertension: Secondary | ICD-10-CM

## 2020-10-14 DIAGNOSIS — Z79899 Other long term (current) drug therapy: Secondary | ICD-10-CM

## 2020-10-14 LAB — BASIC METABOLIC PANEL
BUN/Creatinine Ratio: 13 (ref 9–23)
BUN: 13 mg/dL (ref 6–24)
CO2: 29 mmol/L (ref 20–29)
Calcium: 9.9 mg/dL (ref 8.7–10.2)
Chloride: 96 mmol/L (ref 96–106)
Creatinine, Ser: 1.02 mg/dL — ABNORMAL HIGH (ref 0.57–1.00)
GFR calc Af Amer: 75 mL/min/{1.73_m2} (ref >59)
GFR calc non Af Amer: 65 mL/min/{1.73_m2} (ref >59)
Glucose: 86 mg/dL (ref 65–99)
Potassium: 3.5 mmol/L (ref 3.5–5.2)
Sodium: 139 mmol/L (ref 134–144)

## 2020-12-14 ENCOUNTER — Other Ambulatory Visit: Payer: Self-pay | Admitting: Cardiology

## 2020-12-15 NOTE — Telephone Encounter (Signed)
This is Dr. Scio's pt.  °

## 2020-12-17 ENCOUNTER — Other Ambulatory Visit: Payer: Self-pay

## 2020-12-17 DIAGNOSIS — Z79899 Other long term (current) drug therapy: Secondary | ICD-10-CM

## 2020-12-17 DIAGNOSIS — I10 Essential (primary) hypertension: Secondary | ICD-10-CM

## 2020-12-17 LAB — BASIC METABOLIC PANEL
BUN/Creatinine Ratio: 17 (ref 9–23)
BUN: 12 mg/dL (ref 6–24)
CO2: 24 mmol/L (ref 20–29)
Calcium: 10 mg/dL (ref 8.7–10.2)
Chloride: 97 mmol/L (ref 96–106)
Creatinine, Ser: 0.7 mg/dL (ref 0.57–1.00)
Glucose: 73 mg/dL (ref 65–99)
Potassium: 3.8 mmol/L (ref 3.5–5.2)
Sodium: 138 mmol/L (ref 134–144)
eGFR: 106 mL/min/{1.73_m2} (ref >59)

## 2021-09-16 ENCOUNTER — Encounter (HOSPITAL_BASED_OUTPATIENT_CLINIC_OR_DEPARTMENT_OTHER): Payer: Self-pay | Admitting: Cardiovascular Disease

## 2021-09-16 ENCOUNTER — Ambulatory Visit (HOSPITAL_BASED_OUTPATIENT_CLINIC_OR_DEPARTMENT_OTHER): Payer: BC Managed Care – PPO | Admitting: Cardiovascular Disease

## 2021-09-16 ENCOUNTER — Other Ambulatory Visit: Payer: Self-pay

## 2021-09-16 VITALS — BP 110/72 | HR 95 | Ht 63.0 in | Wt 123.0 lb

## 2021-09-16 DIAGNOSIS — I10 Essential (primary) hypertension: Secondary | ICD-10-CM

## 2021-09-16 DIAGNOSIS — Z79899 Other long term (current) drug therapy: Secondary | ICD-10-CM

## 2021-09-16 DIAGNOSIS — I491 Atrial premature depolarization: Secondary | ICD-10-CM

## 2021-09-16 HISTORY — DX: Atrial premature depolarization: I49.1

## 2021-09-16 NOTE — Assessment & Plan Note (Signed)
Lately she has been feeling a lot of palpitations.  She felt 1 when she had her EKG and I also felt her pulse while she was feeling 1.  Overall this is consistent with PACs and PVCs.  She notes that she is perimenopausal and that she did also have a lot of palpitations during her pregnancies.  I suspect it may be hormone mediated.  She has been on vacation lately and does not seem to be triggered by stress or anxiety.  It is occurred both with and without caffeine.  We will check a CMP, magnesium, TSH, and CBC today.

## 2021-09-16 NOTE — Patient Instructions (Signed)
Medication Instructions:  Continue current medications  *If you need a refill on your cardiac medications before your next appointment, please call your pharmacy*   Lab Work: CBC, CMP, HgB A1C, TSH, Magnesium and Fasting Lipids  If you have labs (blood work) drawn today and your tests are completely normal, you will receive your results only by: MyChart Message (if you have MyChart) OR A paper copy in the mail If you have any lab test that is abnormal or we need to change your treatment, we will call you to review the results.   Testing/Procedures: None Ordered   Follow-Up: At [facility], you and your health needs are our priority.  As part of our continuing mission to provide you with exceptional heart care, we have created designated Provider Care Teams.  These Care Teams include your primary Cardiologist (physician) and Advanced Practice Providers (APPs -  Physician Assistants and Nurse Practitioners) who all work together to provide you with the care you need, when you need it.  We recommend signing up for the patient portal called "MyChart".  Sign up information is provided on this After Visit Summary.  MyChart is used to connect with patients for Virtual Visits (Telemedicine).  Patients are able to view lab/test results, encounter notes, upcoming appointments, etc.  Non-urgent messages can be sent to your provider as well.   To learn more about what you can do with MyChart, go to [email or web address].    Your next appointment:   1 year(s)  The format for your next appointment:   In Person  Provider:   Chilton Si, MD

## 2021-09-16 NOTE — Progress Notes (Signed)
Cardiology Office Note   Date:  09/16/2021   ID:  Sophia Mason, DOB [birth year], MRN [id number]  PCP:  Glendon Axe, MD  Cardiologist:   Skeet Latch, MD   No chief complaint on file.     History of Present Illness: Sophia Mason is a 51 y.o. female here for follow up.  She was initially seen 05/2018 for hypertension management.  Sophia Mason was first diagnosed with hypertension in approximately 2016.  She saw her OB/GYN and her blood pressure was noted to be elevated.  She changed her birth control method and started on the DASH diet with some improvement in her blood pressure.  However when she followed up 10/2017 her blood pressure remained elevated.  She was referred to Dr. Kristie Cowman 11/2017 and started on amlodipine 5 mg daily.  The dose was increased to 10 mg and her blood pressure is well have been controlled but she developed lower extremity edema.  At her last appointment distance she was started on HCTZ.  Since then her blood pressure has been very well-controlled.  She last saw Sophia Mason on 09/2020 and was doing well.  Her blood pressure remains well-controlled on her regimen.  She has noticed some heart fluttering.  Seems to be occurring more in the last 4 to 5 days.  It for started when she was sitting in bed.  It lasted for few seconds and then subsides.  However in the last couple days it has been ongoing off and on throughout the day.  It sometimes makes her feel like she needs to cough.  There is no associated shortness of breath, lightheadedness, or dizziness.  It does not happen with exertion.  At first she thought it was related to caffeine.  However she continued to have episodes despite not ingesting any caffeine.  She has not had any lower extremity edema, orthopnea, or PND.  Past Medical History:  Diagnosis Date   Essential hypertension 05/16/2018   Hypertension    PAC (premature atrial contraction) 09/16/2021    History reviewed. No pertinent surgical  history.   Current Outpatient Medications  Medication Sig Dispense Refill   amLODipine (NORVASC) 5 MG tablet TAKE 1 TABLET(5 MG) BY MOUTH DAILY 90 tablet 3   hydrochlorothiazide (HYDRODIURIL) 12.5 MG tablet TAKE 1 TABLET(12.5 MG) BY MOUTH DAILY 90 tablet 3   PARAGARD INTRAUTERINE COPPER IU by Intrauterine route.     No current facility-administered medications for this visit.    Allergies:   Patient has no known allergies.    Social History:  The patient  reports that she has never smoked. She has never used smokeless tobacco. She reports current alcohol use.   Family History:  The patient's family history includes Breast cancer in her maternal aunt; Diabetes in her maternal aunt; Glaucoma in her maternal grandfather and mother; Hyperlipidemia in her father; Hypertension in her maternal grandfather.    ROS:  Please see the history of present illness.   Otherwise, review of systems are positive for none.   All other systems are reviewed and negative.    PHYSICAL EXAM: VS:  BP 110/72 (BP Location: Left Arm, Patient Position: Sitting, Cuff Size: Normal)    Pulse 95    Ht 5' 3"  (1.6 m)    Wt 123 lb (55.8 kg)    BMI 21.79 kg/m  , BMI Body mass index is 21.79 kg/m. GENERAL:  Well appearing HEENT: Pupils equal round and reactive, fundi not visualized, oral mucosa unremarkable NECK:  No jugular venous distention, waveform within normal limits, carotid upstroke brisk and symmetric, no bruits LUNGS:  Clear to auscultation bilaterally HEART: Mostly regular with occasional ectopy.  PMI not displaced or sustained,S1 and S2 within normal limits, no S3, no S4, no clicks, no rubs, no murmurs ABD:  Flat, positive bowel sounds normal in frequency in pitch, no bruits, no rebound, no guarding, no midline pulsatile mass, no hepatomegaly, no splenomegaly EXT:  2 plus pulses throughout, no edema, no cyanosis no clubbing SKIN:  No rashes no nodules NEURO:  Cranial nerves II through XII grossly intact,  motor grossly intact throughout PSYCH:  Cognitively intact, oriented to person place and time   EKG:  EKG is ordered today. The ekg ordered 05/16/2018 demonstrates sinus rhythm rate 83 bpm. 09/16/2021: Sinus rhythm.  Rate 93 beats minute.  PACs   Recent Labs: 12/17/2020: BUN 12; Creatinine, Ser 0.70; Potassium 3.8; Sodium 138    Lipid Panel No results found for: CHOL, TRIG, HDL, CHOLHDL, VLDL, LDLCALC, LDLDIRECT    Wt Readings from Last 3 Encounters:  09/16/21 123 lb (55.8 kg)  10/13/20 124 lb 12.8 oz (56.6 kg)  07/17/18 121 lb 9.6 oz (55.2 kg)      ASSESSMENT AND PLAN:  Essential hypertension BP very well-controlled on amlodipine and HCTZ.  Continue current regimen.  Check a CMP and magnesium.  We will also get fasting lipids and an A1c as she has not had these recently.  PAC (premature atrial contraction) Lately she has been feeling a lot of palpitations.  She felt 1 when she had her EKG and I also felt her pulse while she was feeling 1.  Overall this is consistent with PACs and PVCs.  She notes that she is perimenopausal and that she did also have a lot of palpitations during her pregnancies.  I suspect it may be hormone mediated.  She has been on vacation lately and does not seem to be triggered by stress or anxiety.  It is occurred both with and without caffeine.  We will check a CMP, magnesium, TSH, and CBC today.    Current medicines are reviewed at length with the patient today.  The patient does not have concerns regarding medicines.  The following changes have been made: None  Labs/ tests ordered today include:   Orders Placed This Encounter  Procedures   Comp Met (CMET)   Lipid panel   TSH   Magnesium   HgB A1c   CBC   EKG 12-Lead     Disposition:   FU with Agripina Guyette C. Oval Linsey, MD, [facility] in 1 year.   Signed, Antjuan Rothe C. Oval Linsey, MD, [facility]  09/16/2021 9:26 AM    [facility]

## 2021-09-16 NOTE — Assessment & Plan Note (Addendum)
BP very well-controlled on amlodipine and HCTZ.  Continue current regimen.  Check a CMP and magnesium.  We will also get fasting lipids and an A1c as she has not had these recently.

## 2021-09-17 LAB — COMPREHENSIVE METABOLIC PANEL
ALT: 12 IU/L (ref 0–32)
AST: 16 IU/L (ref 0–40)
Albumin/Globulin Ratio: 1.7 (ref 1.2–2.2)
Albumin: 4.5 g/dL (ref 3.8–4.8)
Alkaline Phosphatase: 76 IU/L (ref 44–121)
BUN/Creatinine Ratio: 23 (ref 9–23)
BUN: 14 mg/dL (ref 6–24)
Bilirubin Total: 0.3 mg/dL (ref 0.0–1.2)
CO2: 25 mmol/L (ref 20–29)
Calcium: 9.6 mg/dL (ref 8.7–10.2)
Chloride: 101 mmol/L (ref 96–106)
Creatinine, Ser: 0.62 mg/dL (ref 0.57–1.00)
Globulin, Total: 2.6 g/dL (ref 1.5–4.5)
Glucose: 87 mg/dL (ref 70–99)
Potassium: 3.5 mmol/L (ref 3.5–5.2)
Sodium: 139 mmol/L (ref 134–144)
Total Protein: 7.1 g/dL (ref 6.0–8.5)
eGFR: 109 mL/min/{1.73_m2} (ref >59)

## 2021-09-17 LAB — LIPID PANEL
Chol/HDL Ratio: 2.6 ratio (ref 0.0–4.4)
Cholesterol, Total: 188 mg/dL (ref 100–199)
HDL: 73 mg/dL (ref >39)
LDL Chol Calc (NIH): 102 mg/dL — ABNORMAL HIGH (ref 0–99)
Triglycerides: 71 mg/dL (ref 0–149)
VLDL Cholesterol Cal: 13 mg/dL (ref 5–40)

## 2021-09-17 LAB — HEMOGLOBIN A1C
Est. average glucose Bld gHb Est-mCnc: 103 mg/dL
Hgb A1c MFr Bld: 5.2 % (ref 4.8–5.6)

## 2021-09-17 LAB — TSH: TSH: 2.49 u[IU]/mL (ref 0.450–4.500)

## 2021-09-17 LAB — MAGNESIUM: Magnesium: 2.1 mg/dL (ref 1.6–2.3)

## 2021-10-12 ENCOUNTER — Other Ambulatory Visit (HOSPITAL_BASED_OUTPATIENT_CLINIC_OR_DEPARTMENT_OTHER): Payer: Self-pay | Admitting: Obstetrics and Gynecology

## 2021-10-12 DIAGNOSIS — Z1231 Encounter for screening mammogram for malignant neoplasm of breast: Secondary | ICD-10-CM

## 2021-10-14 ENCOUNTER — Other Ambulatory Visit: Payer: Self-pay

## 2021-10-14 ENCOUNTER — Ambulatory Visit (HOSPITAL_BASED_OUTPATIENT_CLINIC_OR_DEPARTMENT_OTHER)
Admission: RE | Admit: 2021-10-14 | Discharge: 2021-10-14 | Disposition: A | Discharge status: Home / Self Care | Payer: BC Managed Care – PPO | Source: Ambulatory Visit | Attending: Obstetrics and Gynecology | Admitting: Obstetrics and Gynecology

## 2021-10-14 ENCOUNTER — Encounter (HOSPITAL_BASED_OUTPATIENT_CLINIC_OR_DEPARTMENT_OTHER): Payer: Self-pay

## 2021-10-14 DIAGNOSIS — Z1231 Encounter for screening mammogram for malignant neoplasm of breast: Secondary | ICD-10-CM | POA: Diagnosis present

## 2021-10-15 ENCOUNTER — Other Ambulatory Visit: Payer: Self-pay | Admitting: Obstetrics and Gynecology

## 2021-10-15 DIAGNOSIS — R928 Other abnormal and inconclusive findings on diagnostic imaging of breast: Secondary | ICD-10-CM

## 2021-11-10 ENCOUNTER — Other Ambulatory Visit: Payer: Self-pay

## 2021-11-10 ENCOUNTER — Ambulatory Visit
Admission: RE | Admit: 2021-11-10 | Discharge: 2021-11-10 | Disposition: A | Discharge status: Home / Self Care | Payer: BC Managed Care – PPO | Source: Ambulatory Visit | Attending: Obstetrics and Gynecology | Admitting: Obstetrics and Gynecology

## 2021-11-10 ENCOUNTER — Other Ambulatory Visit: Payer: Self-pay | Admitting: Obstetrics and Gynecology

## 2021-11-10 DIAGNOSIS — R921 Mammographic calcification found on diagnostic imaging of breast: Secondary | ICD-10-CM

## 2021-11-10 DIAGNOSIS — R928 Other abnormal and inconclusive findings on diagnostic imaging of breast: Secondary | ICD-10-CM

## 2021-11-17 ENCOUNTER — Ambulatory Visit
Admission: RE | Admit: 2021-11-17 | Discharge: 2021-11-17 | Disposition: A | Discharge status: Home / Self Care | Payer: BC Managed Care – PPO | Source: Ambulatory Visit | Attending: Obstetrics and Gynecology | Admitting: Obstetrics and Gynecology

## 2021-11-17 DIAGNOSIS — R921 Mammographic calcification found on diagnostic imaging of breast: Secondary | ICD-10-CM

## 2021-12-18 ENCOUNTER — Other Ambulatory Visit: Payer: Self-pay | Admitting: Cardiovascular Disease

## 2021-12-23 ENCOUNTER — Other Ambulatory Visit: Payer: Self-pay | Admitting: Obstetrics and Gynecology

## 2021-12-23 DIAGNOSIS — N63 Unspecified lump in unspecified breast: Secondary | ICD-10-CM

## 2021-12-23 DIAGNOSIS — N644 Mastodynia: Secondary | ICD-10-CM

## 2021-12-24 LAB — HM PAP SMEAR: HPV, high-risk: NEGATIVE

## 2022-01-15 ENCOUNTER — Ambulatory Visit
Admission: RE | Admit: 2022-01-15 | Discharge: 2022-01-15 | Disposition: A | Discharge status: Home / Self Care | Payer: BC Managed Care – PPO | Source: Ambulatory Visit | Attending: Obstetrics and Gynecology | Admitting: Obstetrics and Gynecology

## 2022-01-15 ENCOUNTER — Other Ambulatory Visit: Payer: Self-pay | Admitting: Obstetrics and Gynecology

## 2022-01-15 ENCOUNTER — Ambulatory Visit: Payer: BC Managed Care – PPO

## 2022-01-15 DIAGNOSIS — N644 Mastodynia: Secondary | ICD-10-CM

## 2022-01-15 DIAGNOSIS — N63 Unspecified lump in unspecified breast: Secondary | ICD-10-CM

## 2022-07-21 ENCOUNTER — Other Ambulatory Visit: Payer: BC Managed Care – PPO

## 2022-07-28 ENCOUNTER — Ambulatory Visit
Admission: RE | Admit: 2022-07-28 | Discharge: 2022-07-28 | Disposition: A | Discharge status: Home / Self Care | Payer: BC Managed Care – PPO | Source: Ambulatory Visit | Attending: Obstetrics and Gynecology | Admitting: Obstetrics and Gynecology

## 2022-07-28 ENCOUNTER — Other Ambulatory Visit: Payer: Self-pay | Admitting: Obstetrics and Gynecology

## 2022-07-28 DIAGNOSIS — N63 Unspecified lump in unspecified breast: Secondary | ICD-10-CM

## 2022-10-08 ENCOUNTER — Encounter: Payer: Self-pay | Admitting: Family Medicine

## 2022-10-08 ENCOUNTER — Ambulatory Visit: Payer: BC Managed Care – PPO | Admitting: Family Medicine

## 2022-10-08 VITALS — BP 120/80 | HR 91 | Temp 98.0°F | Ht 62.0 in | Wt 125.2 lb

## 2022-10-08 DIAGNOSIS — H6121 Impacted cerumen, right ear: Secondary | ICD-10-CM | POA: Diagnosis not present

## 2022-10-08 DIAGNOSIS — I491 Atrial premature depolarization: Secondary | ICD-10-CM

## 2022-10-08 DIAGNOSIS — Z1159 Encounter for screening for other viral diseases: Secondary | ICD-10-CM | POA: Diagnosis not present

## 2022-10-08 DIAGNOSIS — Z23 Encounter for immunization: Secondary | ICD-10-CM | POA: Diagnosis not present

## 2022-10-08 DIAGNOSIS — I10 Essential (primary) hypertension: Secondary | ICD-10-CM

## 2022-10-08 LAB — COMPREHENSIVE METABOLIC PANEL
ALT: 12 U/L (ref 0–35)
AST: 13 U/L (ref 0–37)
Albumin: 4.7 g/dL (ref 3.5–5.2)
Alkaline Phosphatase: 78 U/L (ref 39–117)
BUN: 13 mg/dL (ref 6–23)
CO2: 32 mEq/L (ref 19–32)
Calcium: 9.9 mg/dL (ref 8.4–10.5)
Chloride: 98 mEq/L (ref 96–112)
Creatinine, Ser: 0.57 mg/dL (ref 0.40–1.20)
GFR: 105.65 mL/min (ref >60.00)
Glucose, Bld: 79 mg/dL (ref 70–99)
Potassium: 3.6 mEq/L (ref 3.5–5.1)
Sodium: 139 mEq/L (ref 135–145)
Total Bilirubin: 0.5 mg/dL (ref 0.2–1.2)
Total Protein: 8 g/dL (ref 6.0–8.3)

## 2022-10-08 LAB — CBC WITH DIFFERENTIAL/PLATELET
Basophils Absolute: 0 10*3/uL (ref 0.0–0.1)
Basophils Relative: 0.4 % (ref 0.0–3.0)
Eosinophils Absolute: 0 10*3/uL (ref 0.0–0.7)
Eosinophils Relative: 0.7 % (ref 0.0–5.0)
HCT: 40 % (ref 36.0–46.0)
Hemoglobin: 13.4 g/dL (ref 12.0–15.0)
Lymphocytes Relative: 34.4 % (ref 12.0–46.0)
Lymphs Abs: 2.3 10*3/uL (ref 0.7–4.0)
MCHC: 33.5 g/dL (ref 30.0–36.0)
MCV: 86.8 fl (ref 78.0–100.0)
Monocytes Absolute: 0.4 10*3/uL (ref 0.1–1.0)
Monocytes Relative: 6.6 % (ref 3.0–12.0)
Neutro Abs: 3.8 10*3/uL (ref 1.4–7.7)
Neutrophils Relative %: 57.9 % (ref 43.0–77.0)
Platelets: 360 10*3/uL (ref 150.0–400.0)
RBC: 4.61 Mil/uL (ref 3.87–5.11)
RDW: 13.5 % (ref 11.5–15.5)
WBC: 6.6 10*3/uL (ref 4.0–10.5)

## 2022-10-08 LAB — MICROALBUMIN / CREATININE URINE RATIO
Creatinine,U: 62 mg/dL
Microalb Creat Ratio: 2.2 mg/g (ref 0.0–30.0)
Microalb, Ur: 1.4 mg/dL (ref 0.0–1.9)

## 2022-10-08 LAB — MAGNESIUM: Magnesium: 2.1 mg/dL (ref 1.5–2.5)

## 2022-10-08 LAB — TSH: TSH: 1.25 u[IU]/mL (ref 0.35–5.50)

## 2022-10-08 LAB — LIPID PANEL
Cholesterol: 192 mg/dL (ref 0–200)
HDL: 62.4 mg/dL (ref >39.00)
LDL Cholesterol: 113 mg/dL — ABNORMAL HIGH (ref 0–99)
NonHDL: 129.26
Total CHOL/HDL Ratio: 3
Triglycerides: 79 mg/dL (ref 0.0–149.0)
VLDL: 15.8 mg/dL (ref 0.0–40.0)

## 2022-10-08 MED ORDER — HYDROCHLOROTHIAZIDE 12.5 MG PO TABS: ORAL_TABLET | ORAL | 1 refills | Status: DC

## 2022-10-08 MED ORDER — AMLODIPINE BESYLATE 5 MG PO TABS: ORAL_TABLET | ORAL | 1 refills | Status: DC

## 2022-10-08 NOTE — Progress Notes (Signed)
New Patient Office Visit  Subjective:  Patient ID: Sophia Mason, female    DOB: 09/06/1971  Age: 52 y.o. MRN: [id number]  CC:  Chief Complaint  Patient presents with   Establish Care    Need new pcp Fasting     HPI Destani W Trueba presents for new pt HTN  HTN-Pt is on amlodipine 5mg  and hctz 12.5.  Bp's running rarely checks.  No ha/dizziness/cp//cough/sob  rings getting tighter.  Occ ha.   Occ palp.  Has seen Card. Palp more during hot flashes-getting more.  No symptoms.  Short lived.  R ear wax buidl-up.  Gets stopped up freq. Some hoh  Past Medical History:  Diagnosis Date   Essential hypertension 05/16/2018   Hypertension    PAC (premature atrial contraction) 09/16/2021    Past Surgical History:  Procedure Laterality Date   APPENDECTOMY  2011   It wasn't an appendectomy but my appendix ruptured, and then it was drained.    Family History  Problem Relation Age of Onset   Glaucoma Mother    Hyperlipidemia Father    Breast cancer Maternal Aunt 62   Diabetes Maternal Aunt    Hypertension Maternal Grandfather    Glaucoma Maternal Grandfather     Social History   Socioeconomic History   Marital status: Married    Spouse name: Not on file   Number of children: 1   Years of education: Not on file   Highest education level: Not on file  Occupational History   Occupation: higher ed-admin    Employer: [employer]  Tobacco Use   Smoking status: Never   Smokeless tobacco: Never  Vaping Use   Vaping Use: Never used  Substance and Sexual Activity   Alcohol use: Yes    Alcohol/week: 1.0 standard drink of alcohol    Types: 1 Glasses of wine per week    Comment: Occasional use    Drug use: Never   Sexual activity: Yes    Birth control/protection: I.U.D.  Other Topics Concern   Not on file  Social History Narrative   Not on file   Social Determinants of Health   Financial Resource Strain: Not on file  Food Insecurity: Not on file  Transportation Needs: Not on  file  Physical Activity: Not on file  Stress: Not on file  Social Connections: Not on file  Intimate Partner Violence: Not on file    ROS  ROS: Gen: no fever, chills  Skin: no rash, itching ENT: no ear pain, ear drainage, nasal congestion, rhinorrhea, sinus pressure, sore throat Eyes: no blurry vision, double vision Resp: no cough, wheeze,SOB CV: no CP, palpitations, LE edema,  GI: no heartburn, n/v/d/c, abd pain.  Colon-1-2 yrs ago. GU: no dysuria, urgency, frequency, hematuria.  Pap Dr. cousins MSK: no joint pain, myalgias, back pain Neuro: no dizziness, headache, weakness, vertigo.  Occ numbness R foot toes. Psych: no depression, anxiety, insomnia, SI   Objective:   Today's Vitals: BP 120/80   Pulse 91   Temp 98 F (36.7 C) (Temporal)   Ht 5\' 2"  (1.575 m)   Wt 125 lb 4 oz (56.8 kg)   SpO2 97%   BMI 22.91 kg/m   Physical Exam  Gen: WDWN NAD HEENT: NCAT, conjunctiva not injected, sclera nonicteric TM WNL L.  R occluded w/wax, OP moist, no exudates  NECK:  supple, no thyromegaly, no nodes, no carotid bruits CARDIAC: RRR, S1S2+, no murmur. DP 2+B LUNGS: CTAB. No wheezes ABDOMEN:  BS+, soft, NTND, No HSM, no masses EXT:  no edema MSK: no gross abnormalities.  NEURO: A&O x3.  CN II-XII intact.  PSYCH: normal mood. Good eye contact   Procedure:  verbal consent obtained.  R ear irrig w/water and H2O2.  Large amt wax.  Pt tolerated well and "I can hear".    Assessment & Plan:   Problem List Items Addressed This Visit       Cardiovascular and Mediastinum   Essential hypertension - Primary   Relevant Medications   amLODipine (NORVASC) 5 MG tablet   hydrochlorothiazide (HYDRODIURIL) 12.5 MG tablet   Other Relevant Orders   Comprehensive metabolic panel   Lipid panel   TSH   CBC with Differential/Platelet   Microalbumin / creatinine urine ratio   Magnesium   PAC (premature atrial contraction)   Relevant Medications   amLODipine (NORVASC) 5 MG tablet    hydrochlorothiazide (HYDRODIURIL) 12.5 MG tablet   Other Relevant Orders   Comprehensive metabolic panel   Magnesium   Other Visit Diagnoses     Encounter for hepatitis C screening test for low risk patient       Relevant Orders   Hepatitis C antibody   Need for Tdap vaccination       Relevant Orders   Tdap vaccine greater than or equal to 7yo IM (Completed)   Impacted cerumen of right ear          HTN-chronic.  Controlled on amlodipine 5mg  and hctz 12.5 mg.  Renewed.  Check cbc,cmp,tsh,lipids,mg, urine microalb/creat ratio.  Sees card in 6 mo.   Palpitations-no symptoms.  Not wanting meds.  K on low end of normal-suggested K supps otc, pt would rather get in diet.   Check cmp,mg. Cerumen R ear-lies on that side.  Discussed poss prevention.  Irrig today and resolved.  May need to do occ.   F/u annual.   Outpatient Encounter Medications as of 10/08/2022  Medication Sig   PARAGARD INTRAUTERINE COPPER IU by Intrauterine route.   [DISCONTINUED] amLODipine (NORVASC) 5 MG tablet TAKE 1 TABLET(5 MG) BY MOUTH DAILY   [DISCONTINUED] hydrochlorothiazide (HYDRODIURIL) 12.5 MG tablet TAKE 1 TABLET(12.5 MG) BY MOUTH DAILY   amLODipine (NORVASC) 5 MG tablet TAKE 1 TABLET(5 MG) BY MOUTH DAILY   hydrochlorothiazide (HYDRODIURIL) 12.5 MG tablet TAKE 1 TABLET(12.5 MG) BY MOUTH DAILY   No facility-administered encounter medications on file as of 10/08/2022.    Follow-up: Return in about 1 year (around 10/09/2023) for annual.   Wellington Hampshire, MD

## 2022-10-08 NOTE — Patient Instructions (Signed)
Welcome to [facility] [place]! It was a pleasure meeting you today. ° °As discussed, Please schedule a 12 month follow up visit today. ° °PLEASE NOTE: ° °If you had any LAB tests please let us know if you have not heard back within a few days. You may see your results on MyChart before we have a chance to review them but we will give you a call once they are reviewed by us. If we ordered any REFERRALS today, please let us know if you have not heard from their office within the next week.  °Let us know through MyChart if you are needing REFILLS, or have your pharmacy send us the request. You can also use MyChart to communicate with me or any office staff. ° °Please try these tips to maintain a healthy lifestyle: ° °Eat most of your calories during the day when you are active. Eliminate processed foods including packaged sweets (pies, cakes, cookies), reduce intake of potatoes, white bread, white pasta, and white rice. Look for whole grain options, oat flour or almond flour. ° °Each meal should contain half fruits/vegetables, one quarter protein, and one quarter carbs (no bigger than a computer mouse). ° °Cut down on sweet beverages. This includes juice, soda, and sweet tea. Also watch fruit intake, though this is a healthier sweet option, it still contains natural sugar! Limit to 3 servings daily. ° °Drink at least 1 glass of water with each meal and aim for at least 8 glasses per day ° °Exercise at least 150 minutes every week.   °

## 2022-10-09 LAB — HEPATITIS C ANTIBODY: Hepatitis C Ab: NONREACTIVE

## 2023-01-25 ENCOUNTER — Ambulatory Visit
Admission: RE | Admit: 2023-01-25 | Discharge: 2023-01-25 | Disposition: A | Discharge status: Home / Self Care | Payer: BC Managed Care – PPO | Source: Ambulatory Visit | Attending: Obstetrics and Gynecology | Admitting: Obstetrics and Gynecology

## 2023-01-25 DIAGNOSIS — N63 Unspecified lump in unspecified breast: Secondary | ICD-10-CM

## 2023-03-09 ENCOUNTER — Encounter (HOSPITAL_BASED_OUTPATIENT_CLINIC_OR_DEPARTMENT_OTHER): Payer: Self-pay | Admitting: Cardiovascular Disease

## 2023-03-09 ENCOUNTER — Ambulatory Visit (HOSPITAL_BASED_OUTPATIENT_CLINIC_OR_DEPARTMENT_OTHER): Payer: BC Managed Care – PPO | Admitting: Cardiovascular Disease

## 2023-03-09 VITALS — BP 100/64 | HR 81 | Ht 62.0 in | Wt 125.3 lb

## 2023-03-09 DIAGNOSIS — I491 Atrial premature depolarization: Secondary | ICD-10-CM

## 2023-03-09 DIAGNOSIS — I10 Essential (primary) hypertension: Secondary | ICD-10-CM

## 2023-03-09 NOTE — Patient Instructions (Signed)
Medication Instructions:  ?Your physician recommends that you continue on your current medications as directed. Please refer to the Current Medication list given to you today.  ? ?Labwork: ?NONE ? ?Testing/Procedures: ?NONE ? ?Follow-Up: ?AS NEEDED  ? ?  ?

## 2023-03-09 NOTE — Progress Notes (Signed)
Cardiology Office Note:  .    Date:  03/09/2023  ID:  Sophia Mason, DOB Jun 27, 1971, MRN [id number] PCP: Jeani Sow, MD  [facility] Providers Cardiologist:  Chilton Si, MD     History of Present Illness: .    Sophia Mason is a 52 y.o. female with hypertension here for follow up.  She was initially seen 05/2018 for hypertension management.  Sophia Mason was first diagnosed with hypertension in approximately 2016.  She saw her OB/GYN and her blood pressure was noted to be elevated.  She changed her birth control method and started on the DASH diet with some improvement in her blood pressure.  However when she followed up 10/2017 her blood pressure remained elevated.  She was referred to Dr. Knox Royalty 11/2017 and started on amlodipine 5 mg daily.  The dose was increased to 10 mg and her blood pressure was well-controlled but she developed lower extremity edema.  Lower pain was reduced and she was started on HCTZ.  Since then her blood pressure has been very well-controlled.    She noted some palpitations and was noted to have PACs on EKG in the office.  Labs were unremarkable and no medications were recommended.  Today, she states that she is feeling well. In the office her BP is 100/64, although she notes that she took her antihypertensives right before she came to her appointment. She denies any issues with lightheadedness or dizziness. This past Sunday she had measured her home blood pressure at close to 120/80. Every once in a while she may experience some palpitations, often associated with her menses. In the past few months her palpitations have not been a significant issue. Trying to stay active, but she is not happy with her weight at 125 lbs today. In March she had returned to working out in [employer], limited by more frequent travel recently. She denies any chest pain, shortness of breath, peripheral edema, headaches, syncope, orthopnea, or PND.  ROS:  Please see the history of  present illness. All other systems are reviewed and negative.   Studies Reviewed: Marland Kitchen       EKG 03/10/23: Sinus rhythm.  Rate 81 bpm  Risk Assessment/Calculations:             Physical Exam:    VS:  BP 100/64   Pulse 81   Ht 5\' 2"  (1.575 m)   Wt 125 lb 4.8 oz (56.8 kg)   BMI 22.92 kg/m  , BMI Body mass index is 22.92 kg/m. GENERAL:  Well appearing HEENT: Pupils equal round and reactive, fundi not visualized, oral mucosa unremarkable NECK:  No jugular venous distention, waveform within normal limits, carotid upstroke brisk and symmetric, no bruits, no thyromegaly LUNGS:  Clear to auscultation bilaterally HEART:  RRR.  PMI not displaced or sustained,S1 and S2 within normal limits, no S3, no S4, no clicks, no rubs, no murmurs ABD:  Flat, positive bowel sounds normal in frequency in pitch, no bruits, no rebound, no guarding, no midline pulsatile mass, no hepatomegaly, no splenomegaly EXT:  2 plus pulses throughout, no edema, no cyanosis no clubbing SKIN:  No rashes no nodules NEURO:  Cranial nerves II through XII grossly intact, motor grossly intact throughout PSYCH:  Cognitively intact, oriented to person place and time  Wt Readings from Last 3 Encounters:  03/09/23 125 lb 4.8 oz (56.8 kg)  10/08/22 125 lb 4 oz (56.8 kg)  09/16/21 123 lb (55.8 kg)   ASSESSMENT AND  PLAN: .    # Hypertension: Blood pressure was low today, but patient is asymptomatic. Patient is on antihypertensive medication and checks blood pressure monthly. -Continue current antihypertensive medication. -Advise patient to monitor for symptoms of hypotension such as dizziness or lightheadedness.  # Cardiovascular Health: Patient has no family history of heart disease. Discussed the option of a coronary calcium score for further cardiovascular risk assessment. -Consider coronary calcium score for further cardiovascular risk assessment. Patient to decide.  # Palpitations: Patient reports palpitations during times  of menstrual irregularity. No recent episodes. -Advise patient to monitor and report any recurrent episodes of palpitations.  General Health Maintenance: Patient is active and has a normal BMI. Discussed the importance of regular exercise. -Encourage patient to continue regular exercise and maintain a healthy diet.      Dispo:  FU with Tamira Ryland C. Duke Salvia, MD, [facility] as needed.  I,Mathew Stumpf,acting as a Neurosurgeon for Chilton Si, MD.,have documented all relevant documentation on the behalf of Chilton Si, MD,as directed by  Chilton Si, MD while in the presence of Chilton Si, MD.  I, Chinmayi Rumer C. Duke Salvia, MD have reviewed all documentation for this visit.  The documentation of the exam, diagnosis, procedures, and orders on 03/09/2023 are all accurate and complete.   Signed, Chilton Si, MD

## 2023-03-09 NOTE — Progress Notes (Deleted)
  Cardiology Office Note:  .   Date:  03/09/2023  ID:  Sophia Mason, DOB 12/20/1970, MRN [id number] PCP: Sophia Sow, MD  [facility] Providers Cardiologist:  Chilton Si, MD { Click to update primary MD,subspecialty MD or APP then REFRESH:1}   History of Present Illness: .   Sophia Mason is a 52 y.o. female with hypertension here for follow up.  She was initially seen 05/2018 for hypertension management.  [name] was first diagnosed with hypertension in approximately 2016.  She saw her OB/GYN and her blood pressure was noted to be elevated.  She changed her birth control method and started on the DASH diet with some improvement in her blood pressure.  However when she followed up 10/2017 her blood pressure remained elevated.  She was referred to Dr. Knox Royalty 11/2017 and started on amlodipine 5 mg daily.  The dose was increased to 10 mg and her blood pressure was well-controlled but she developed lower extremity edema.  Lower pain was reduced and she was started on HCTZ.  Since then her blood pressure has been very well-controlled.    She noted some palpitations and was noted to have PACs on EKG in the office.  Labs were unremarkable and no medications were recommended.  ROS: ***  Studies Reviewed: .        *** Risk Assessment/Calculations:   {Does this patient have ATRIAL FIBRILLATION?:[phone]} No BP recorded.  {Refresh Note OR Click here to enter BP  :1}***       Physical Exam:   VS:  There were no vitals taken for this visit.   Wt Readings from Last 3 Encounters:  10/08/22 125 lb 4 oz (56.8 kg)  09/16/21 123 lb (55.8 kg)  10/13/20 124 lb 12.8 oz (56.6 kg)    GEN: Well nourished, well developed in no acute distress NECK: No JVD; No carotid bruits CARDIAC: ***RRR, no murmurs, rubs, gallops RESPIRATORY:  Clear to auscultation without rales, wheezing or rhonchi  ABDOMEN: Soft, non-tender, non-distended EXTREMITIES:  No edema; No deformity   ASSESSMENT AND PLAN:  .   ***    {Are you ordering a CV Procedure (e.g. stress test, cath, DCCV, TEE, etc)?   Press F2        :[id number]}  Dispo: ***  Signed, Chilton Si, MD

## 2023-06-27 ENCOUNTER — Other Ambulatory Visit: Payer: Self-pay | Admitting: Family Medicine

## 2024-01-07 ENCOUNTER — Other Ambulatory Visit: Payer: Self-pay | Admitting: Family Medicine

## 2024-01-07 MED ORDER — AMLODIPINE BESYLATE 5 MG PO TABS: ORAL_TABLET | ORAL | 0 refills | Status: DC

## 2024-01-07 MED ORDER — HYDROCHLOROTHIAZIDE 12.5 MG PO TABS
ORAL_TABLET | ORAL | 0 refills | Status: DC
Start: 2024-01-07 — End: 2024-02-08

## 2024-01-09 ENCOUNTER — Telehealth: Payer: Self-pay | Admitting: Family Medicine

## 2024-01-09 NOTE — Telephone Encounter (Signed)
 LVM to schedule BP FU with PCP

## 2024-02-07 ENCOUNTER — Other Ambulatory Visit: Payer: Self-pay | Admitting: Family Medicine

## 2024-02-07 NOTE — Telephone Encounter (Signed)
 Needs appt

## 2024-02-08 ENCOUNTER — Other Ambulatory Visit: Payer: Self-pay | Admitting: Family Medicine

## 2024-02-08 NOTE — Telephone Encounter (Signed)
 Needs appt

## 2024-02-16 ENCOUNTER — Ambulatory Visit (INDEPENDENT_AMBULATORY_CARE_PROVIDER_SITE_OTHER): Admitting: Family Medicine

## 2024-02-16 VITALS — BP 130/84 | HR 88 | Temp 97.7°F | Resp 16 | Ht 62.0 in | Wt 125.4 lb

## 2024-02-16 DIAGNOSIS — I10 Essential (primary) hypertension: Secondary | ICD-10-CM | POA: Diagnosis not present

## 2024-02-16 DIAGNOSIS — Z0000 Encounter for general adult medical examination without abnormal findings: Secondary | ICD-10-CM

## 2024-02-16 LAB — CBC WITH DIFFERENTIAL/PLATELET
Basophils Absolute: 0 10*3/uL (ref 0.0–0.1)
Basophils Relative: 0.6 % (ref 0.0–3.0)
Eosinophils Absolute: 0.1 10*3/uL (ref 0.0–0.7)
Eosinophils Relative: 1.4 % (ref 0.0–5.0)
HCT: 40.5 % (ref 36.0–46.0)
Hemoglobin: 13.8 g/dL (ref 12.0–15.0)
Lymphocytes Relative: 36.9 % (ref 12.0–46.0)
Lymphs Abs: 2 10*3/uL (ref 0.7–4.0)
MCHC: 34 g/dL (ref 30.0–36.0)
MCV: 86.6 fl (ref 78.0–100.0)
Monocytes Absolute: 0.4 10*3/uL (ref 0.1–1.0)
Monocytes Relative: 7 % (ref 3.0–12.0)
Neutro Abs: 3 10*3/uL (ref 1.4–7.7)
Neutrophils Relative %: 54.1 % (ref 43.0–77.0)
Platelets: 325 10*3/uL (ref 150.0–400.0)
RBC: 4.68 Mil/uL (ref 3.87–5.11)
RDW: 13.6 % (ref 11.5–15.5)
WBC: 5.5 10*3/uL (ref 4.0–10.5)

## 2024-02-16 LAB — TSH: TSH: 1.51 u[IU]/mL (ref 0.35–5.50)

## 2024-02-16 LAB — HEMOGLOBIN A1C: Hgb A1c MFr Bld: 5.2 % (ref 4.6–6.5)

## 2024-02-16 MED ORDER — HYDROCHLOROTHIAZIDE 12.5 MG PO TABS: ORAL_TABLET | ORAL | 3 refills | Status: AC

## 2024-02-16 MED ORDER — AMLODIPINE BESYLATE 5 MG PO TABS: ORAL_TABLET | ORAL | 3 refills | Status: AC

## 2024-02-16 NOTE — Patient Instructions (Signed)
 It was very nice to see you today!  See Dr. Lesta Rater for female exam   PLEASE NOTE:  If you had any lab tests please let us  know if you have not heard back within a few days. You may see your results on MyChart before we have a chance to review them but we will give you a call once they are reviewed by us . If we ordered any referrals today, please let us  know if you have not heard from their office within the next week.   Please try these tips to maintain a healthy lifestyle:  Eat most of your calories during the day when you are active. Eliminate processed foods including packaged sweets (pies, cakes, cookies), reduce intake of potatoes, white bread, white pasta, and white rice. Look for whole grain options, oat flour or almond flour.  Each meal should contain half fruits/vegetables, one quarter protein, and one quarter carbs (no bigger than a computer mouse).  Cut down on sweet beverages. This includes juice, soda, and sweet tea. Also watch fruit intake, though this is a healthier sweet option, it still contains natural sugar! Limit to 3 servings daily.  Drink at least 1 glass of water with each meal and aim for at least 8 glasses per day  Exercise at least 150 minutes every week.

## 2024-02-16 NOTE — Progress Notes (Signed)
 Phone [phone]   Subjective:   Patient is a 53 y.o. female presenting for annual physical.    Chief Complaint  Patient presents with   Annual Exam    CPE Fasting    Annual-gyn Dr. Lesta Rater.  G1P1.  Last pap unk. No abn pap.  Exercises few x/wk Htn Discussed the use of AI scribe software for clinical note transcription with the patient, who gave verbal consent to proceed.  History of Present Illness Sophia Mason is a 53 year old female with hypertension who presents for a routine follow-up and medication refill.  She is currently taking amlodipine  5 mg and hydrochlorothiazide  12.5 mg daily for hypertension. Her blood pressure readings at home are typically around 120/82 mmHg, though today's reading was 130/84 mmHg. She experiences occasional headaches, which she attributes to allergies or weather changes, and they do not correlate with elevated blood pressure readings. No chest pain, shortness of breath, or dizziness.  She is perimenopausal and has an IUD placed before COVID-19. She experiences heart palpitations, which she associates with perimenopausal symptoms, occurring intermittently and not related to caffeine intake. These palpitations do not cause dizziness or lightheadedness. No significant mood changes reported.  She reports seasonal allergies with symptoms including sneezing and nasal congestion. She exercises two to three times a week but wants to increase this to five times a week. She experiences joint stiffness, particularly in the mornings, which improves with movement.  Her last mammogram was on Jan 25, 2023, and she is due for another. She has a history of dense breast tissue but no growths have been found. She is behind on her Pap smears. Her last colonoscopy was in 2022, and she is not due for another until 2032.  She has one child from one pregnancy and no history of miscarriages. She denies any history of abnormal Pap smears. She is not experiencing any  gastrointestinal symptoms or urinary issues.    See problem oriented charting- ROS- ROS: Gen: no fever, chills  Skin: no rash, itching ENT: no ear pain, ear drainage, nasal congestion, rhinorrhea, sinus pressure, sore throat.  Some allergies Eyes: no blurry vision, double vision Resp: no cough, wheeze,SOB CV: no CP,  LE edema,   occ flutters intermitt.  Can last all day GI: no heartburn, n/v/d/c, abd pain GU: no dysuria, urgency, frequency, hematuria.  Irreg menses MSK: no joint pain, myalgias, back pain Neuro: no dizziness, , weakness, vertigo.  Some ha occ. Psych: no depression, anxiety, insomnia, SI   The following were reviewed and entered/updated in epic: Past Medical History:  Diagnosis Date   Essential hypertension 05/16/2018   Hypertension    PAC (premature atrial contraction) 09/16/2021   Patient Active Problem List   Diagnosis Date Noted   PAC (premature atrial contraction) 09/16/2021   Essential hypertension 05/16/2018   Past Surgical History:  Procedure Laterality Date   APPENDECTOMY  2011   It wasn't an appendectomy but my appendix ruptured, and then it was drained.    Family History  Problem Relation Age of Onset   Glaucoma Mother    Hyperlipidemia Father    Breast cancer Maternal Aunt 51   Diabetes Maternal Aunt    Hypertension Maternal Grandfather    Glaucoma Maternal Grandfather     Medications- reviewed and updated Current Outpatient Medications  Medication Sig Dispense Refill   PARAGARD INTRAUTERINE COPPER IU by Intrauterine route.     amLODipine  (NORVASC ) 5 MG tablet TAKE 1 TABLET(5 MG) BY MOUTH DAILY 90 tablet 3  hydrochlorothiazide  (HYDRODIURIL ) 12.5 MG tablet TAKE 1 TABLET(12.5 MG) BY MOUTH DAILY. NEEDS APPT FOR ADDITIONAL REFILLS 90 tablet 3   No current facility-administered medications for this visit.    Allergies-reviewed and updated No Known Allergies  Social History   Social History Narrative   Not on file   Objective   Objective:  BP 130/84   Pulse 88   Temp 97.7 F (36.5 C) (Temporal)   Resp 16   Ht 5\' 2"  (1.575 m)   Wt 125 lb 6 oz (56.9 kg)   LMP 01/25/2024 (Exact Date)   SpO2 100%   BMI 22.93 kg/m  Physical Exam  Gen: WDWN NAD HEENT: NCAT, conjunctiva not injected, sclera nonicteric TM WNL B, OP moist, no exudates  NECK:  supple, no thyromegaly, no nodes, no carotid bruits CARDIAC: RRR, S1S2+, no murmur. DP 2+B LUNGS: CTAB. No wheezes ABDOMEN:  BS+, soft, NTND, No HSM, no masses EXT:  no edema MSK: no gross abnormalities. MS 5/5 all 4 NEURO: A&O x3.  CN II-XII intact.  PSYCH: normal mood. Good eye contact     Assessment and Plan   Health Maintenance counseling: 1. Anticipatory guidance: Patient counseled regarding regular dental exams q6 months, eye exams,  avoiding smoking and second hand smoke, limiting alcohol to 1 beverage per day, no illicit drugs.   2. Risk factor reduction:  Advised patient of need for regular exercise and diet rich and fruits and vegetables to reduce risk of heart attack and stroke. Exercise- encourage.  Wt Readings from Last 3 Encounters:  02/16/24 125 lb 6 oz (56.9 kg)  03/09/23 125 lb 4.8 oz (56.8 kg)  10/08/22 125 lb 4 oz (56.8 kg)   3. Immunizations/screenings/ancillary studies Immunization History  Administered Date(s) Administered   Janssen (J&J) SARS-COV-2 Vaccination 12/21/2019   Moderna Sars-Covid-2 Vaccination 09/02/2020   Tdap 10/08/2022   Zoster Recombinant(Shingrix) 02/04/2022, 05/12/2022   Health Maintenance Due  Topic Date Due   Cervical Cancer Screening (HPV/Pap Cotest)  02/25/2015    4. Cervical cancer screening- see gyn 5. Breast cancer screening-  mammogram sch gyn 6. Colon cancer screening - utd 7. Skin cancer screening- advised regular sunscreen use. Denies worrisome, changing, or new skin lesions.  8. Birth control/STD check- IUD 9. Osteoporosis screening- n/a 10. Smoking associated screening - non smoker  Wellness  examination -     CBC with Differential/Platelet -     Comprehensive metabolic panel with GFR -     Lipid panel -     TSH -     Hemoglobin A1c -     Magnesium  Essential hypertension  Other orders -     amLODIPine  Besylate; TAKE 1 TABLET(5 MG) BY MOUTH DAILY  Dispense: 90 tablet; Refill: 3 -     hydroCHLOROthiazide ; TAKE 1 TABLET(12.5 MG) BY MOUTH DAILY. NEEDS APPT FOR ADDITIONAL REFILLS  Dispense: 90 tablet; Refill: 3  Wellness-anticipatory guidance.  Work on Diet/Exercise  Check CBC,CMP,lipids,TSH, A1C.  F/u 1 yr  Assessment and Plan Assessment & Plan Hypertension   Hypertension is well-controlled with amlodipine  5 mg and hydrochlorothiazide  12.5 mg daily. Home blood pressure readings are typically 120-124/82 mmHg, . Continue amlodipine  5 mg and hydrochlorothiazide  12.5 mg daily. Monitor blood pressure regularly.  Perimenopausal Symptoms   She experiences perimenopausal symptoms including irregular periods, hot flashes, and palpitations. Symptoms correlate with hormonal changes. Palpitations are not associated with dizziness or lightheadedness and are considered normal during perimenopause by her cardiologist. Consider management options if symptoms worsen.  Intrauterine Device (  IUD) Management   She has a copper IUD placed before COVID-19, exact date unknown. She will follow up with her gynecologist, Dr. Lesta Rater, for evaluation and management of the IUD. Schedule an appointment with the gynecologist for IUD management. And pap  General Health Maintenance   She is due for a Pap smear and mammogram. The last mammogram on Jan 25, 2023, was normal. Pap smear history is unclear, and she is considering having it done in the current practice for convenience. Colonoscopy is up to date, last done in 2022. Tetanus vaccination is up to date. Discussed pneumonia vaccine, recommended for those over 50. Schedule Pap smear and mammogram. Discuss pneumonia vaccine. Encourage regular exercise and  stretching.    Recommended follow up: Return in about 1 year (around 02/15/2025) for annual physical, HTN.  Lab/Order associations:+ fasting  Ellsworth Haas, MD

## 2024-02-17 ENCOUNTER — Ambulatory Visit: Payer: Self-pay | Admitting: Family Medicine

## 2024-02-17 LAB — COMPREHENSIVE METABOLIC PANEL WITH GFR
ALT: 21 U/L (ref 0–35)
AST: 24 U/L (ref 0–37)
Albumin: 4.6 g/dL (ref 3.5–5.2)
Alkaline Phosphatase: 75 U/L (ref 39–117)
BUN: 15 mg/dL (ref 6–23)
CO2: 27 meq/L (ref 19–32)
Calcium: 10.2 mg/dL (ref 8.4–10.5)
Chloride: 100 meq/L (ref 96–112)
Creatinine, Ser: 0.57 mg/dL (ref 0.40–1.20)
GFR: 104.64 mL/min (ref >60.00)
Glucose, Bld: 78 mg/dL (ref 70–99)
Potassium: 3.5 meq/L (ref 3.5–5.1)
Sodium: 139 meq/L (ref 135–145)
Total Bilirubin: 0.4 mg/dL (ref 0.2–1.2)
Total Protein: 8 g/dL (ref 6.0–8.3)

## 2024-02-17 LAB — LIPID PANEL
Cholesterol: 189 mg/dL (ref 0–200)
HDL: 65.7 mg/dL (ref >39.00)
LDL Cholesterol: 111 mg/dL — ABNORMAL HIGH (ref 0–99)
NonHDL: 122.91
Total CHOL/HDL Ratio: 3
Triglycerides: 60 mg/dL (ref 0.0–149.0)
VLDL: 12 mg/dL (ref 0.0–40.0)

## 2024-02-17 LAB — MAGNESIUM: Magnesium: 2.1 mg/dL (ref 1.5–2.5)

## 2024-02-17 NOTE — Progress Notes (Signed)
 Labs great except Potassium a little low and she takes the hydrochlorothiazide  daily.  Does she want to otc supplement daily of potassium or rx(if rx 10meq daily #90/3)

## 2024-02-27 ENCOUNTER — Other Ambulatory Visit: Payer: Self-pay | Admitting: Family Medicine

## 2024-02-27 DIAGNOSIS — R921 Mammographic calcification found on diagnostic imaging of breast: Secondary | ICD-10-CM

## 2024-03-06 ENCOUNTER — Other Ambulatory Visit: Payer: Self-pay | Admitting: Family Medicine

## 2024-03-06 DIAGNOSIS — R92322 Mammographic fibroglandular density, left breast: Secondary | ICD-10-CM

## 2024-03-12 ENCOUNTER — Ambulatory Visit
Admission: RE | Admit: 2024-03-12 | Discharge: 2024-03-12 | Disposition: A | Discharge status: Home / Self Care | Source: Ambulatory Visit | Attending: Family Medicine | Admitting: Family Medicine

## 2024-03-12 DIAGNOSIS — R92322 Mammographic fibroglandular density, left breast: Secondary | ICD-10-CM

## 2024-03-18 ENCOUNTER — Other Ambulatory Visit: Payer: Self-pay | Admitting: Family Medicine

## 2025-02-19 ENCOUNTER — Encounter: Admitting: Family Medicine
